# Patient Record
Sex: Female | Born: 1935 | Race: Black or African American | Hispanic: No | Marital: Married | Smoking: Never smoker
Health system: Southern US, Community
[De-identification: ages and names within clinical notes are randomized; demographics above are authoritative.]

## PROBLEM LIST (undated history)

## (undated) DIAGNOSIS — I2510 Atherosclerotic heart disease of native coronary artery without angina pectoris: Secondary | ICD-10-CM

## (undated) DIAGNOSIS — E559 Vitamin D deficiency, unspecified: Secondary | ICD-10-CM

## (undated) DIAGNOSIS — E039 Hypothyroidism, unspecified: Secondary | ICD-10-CM

## (undated) DIAGNOSIS — M8580 Other specified disorders of bone density and structure, unspecified site: Secondary | ICD-10-CM

## (undated) DIAGNOSIS — D649 Anemia, unspecified: Secondary | ICD-10-CM

## (undated) DIAGNOSIS — I219 Acute myocardial infarction, unspecified: Secondary | ICD-10-CM

## (undated) DIAGNOSIS — A048 Other specified bacterial intestinal infections: Secondary | ICD-10-CM

## (undated) DIAGNOSIS — K5790 Diverticulosis of intestine, part unspecified, without perforation or abscess without bleeding: Secondary | ICD-10-CM

## (undated) DIAGNOSIS — M1990 Unspecified osteoarthritis, unspecified site: Secondary | ICD-10-CM

## (undated) DIAGNOSIS — K449 Diaphragmatic hernia without obstruction or gangrene: Secondary | ICD-10-CM

## (undated) DIAGNOSIS — K219 Gastro-esophageal reflux disease without esophagitis: Secondary | ICD-10-CM

## (undated) DIAGNOSIS — E119 Type 2 diabetes mellitus without complications: Secondary | ICD-10-CM

## (undated) DIAGNOSIS — I10 Essential (primary) hypertension: Secondary | ICD-10-CM

## (undated) DIAGNOSIS — I456 Pre-excitation syndrome: Secondary | ICD-10-CM

## (undated) HISTORY — DX: Acute myocardial infarction, unspecified: I21.9

## (undated) HISTORY — DX: Diaphragmatic hernia without obstruction or gangrene: K44.9

## (undated) HISTORY — PX: APPENDECTOMY: SHX54

## (undated) HISTORY — DX: Gastro-esophageal reflux disease without esophagitis: K21.9

## (undated) HISTORY — DX: Unspecified osteoarthritis, unspecified site: M19.90

## (undated) HISTORY — DX: Anemia, unspecified: D64.9

## (undated) HISTORY — DX: Diverticulosis of intestine, part unspecified, without perforation or abscess without bleeding: K57.90

## (undated) HISTORY — DX: Other specified disorders of bone density and structure, unspecified site: M85.80

## (undated) HISTORY — PX: CATARACT EXTRACTION: SUR2

## (undated) HISTORY — PX: COLONOSCOPY: SHX174

## (undated) HISTORY — PX: BREAST SURGERY: SHX581

## (undated) HISTORY — DX: Vitamin D deficiency, unspecified: E55.9

## (undated) HISTORY — DX: Hypothyroidism, unspecified: E03.9

## (undated) HISTORY — DX: Atherosclerotic heart disease of native coronary artery without angina pectoris: I25.10

## (undated) HISTORY — DX: Pre-excitation syndrome: I45.6

## (undated) HISTORY — DX: Other specified bacterial intestinal infections: A04.8

## (undated) HISTORY — DX: Type 2 diabetes mellitus without complications: E11.9

## (undated) HISTORY — DX: Essential (primary) hypertension: I10

## (undated) HISTORY — PX: ABDOMINAL ADHESION SURGERY: SHX90

---

## 1978-12-23 DIAGNOSIS — I2510 Atherosclerotic heart disease of native coronary artery without angina pectoris: Secondary | ICD-10-CM

## 1978-12-23 HISTORY — DX: Atherosclerotic heart disease of native coronary artery without angina pectoris: I25.10

## 1997-02-19 HISTORY — PX: CARDIAC CATHETERIZATION: SHX172

## 1997-04-23 DIAGNOSIS — I456 Pre-excitation syndrome: Secondary | ICD-10-CM

## 1997-04-23 HISTORY — DX: Pre-excitation syndrome: I45.6

## 1997-05-19 HISTORY — PX: ABLATION: SHX5711

## 1998-04-07 ENCOUNTER — Ambulatory Visit (HOSPITAL_COMMUNITY): Admission: RE | Admit: 1998-04-07 | Discharge: 1998-04-07 | Payer: Self-pay | Admitting: Family Medicine

## 1998-06-21 ENCOUNTER — Other Ambulatory Visit: Admission: RE | Admit: 1998-06-21 | Discharge: 1998-06-21 | Payer: Self-pay | Admitting: *Deleted

## 1999-10-02 ENCOUNTER — Other Ambulatory Visit: Admission: RE | Admit: 1999-10-02 | Discharge: 1999-10-02 | Payer: Self-pay | Admitting: *Deleted

## 1999-10-24 ENCOUNTER — Encounter: Payer: Self-pay | Admitting: Family Medicine

## 1999-10-24 ENCOUNTER — Encounter: Admission: RE | Admit: 1999-10-24 | Discharge: 1999-10-24 | Payer: Self-pay | Admitting: Family Medicine

## 2000-12-10 ENCOUNTER — Other Ambulatory Visit: Admission: RE | Admit: 2000-12-10 | Discharge: 2000-12-10 | Payer: Self-pay | Admitting: *Deleted

## 2001-09-15 ENCOUNTER — Inpatient Hospital Stay (HOSPITAL_COMMUNITY): Admission: EM | Admit: 2001-09-15 | Discharge: 2001-09-23 | Payer: Self-pay | Admitting: Emergency Medicine

## 2001-09-16 ENCOUNTER — Encounter: Payer: Self-pay | Admitting: General Surgery

## 2001-09-17 ENCOUNTER — Encounter: Payer: Self-pay | Admitting: General Surgery

## 2001-09-18 ENCOUNTER — Encounter: Payer: Self-pay | Admitting: General Surgery

## 2003-03-30 ENCOUNTER — Encounter: Admission: RE | Admit: 2003-03-30 | Discharge: 2003-03-30 | Payer: Self-pay | Admitting: Family Medicine

## 2008-09-01 ENCOUNTER — Emergency Department (HOSPITAL_COMMUNITY): Admission: EM | Admit: 2008-09-01 | Discharge: 2008-09-01 | Payer: Self-pay | Admitting: Family Medicine

## 2009-04-22 ENCOUNTER — Emergency Department (HOSPITAL_COMMUNITY): Admission: EM | Admit: 2009-04-22 | Discharge: 2009-04-22 | Payer: Self-pay | Admitting: Emergency Medicine

## 2009-04-23 DIAGNOSIS — K5790 Diverticulosis of intestine, part unspecified, without perforation or abscess without bleeding: Secondary | ICD-10-CM

## 2009-04-23 HISTORY — DX: Diverticulosis of intestine, part unspecified, without perforation or abscess without bleeding: K57.90

## 2010-07-24 LAB — POCT I-STAT, CHEM 8
BUN: 11 mg/dL (ref 6–23)
Calcium, Ion: 1.22 mmol/L (ref 1.12–1.32)
Chloride: 103 mEq/L (ref 96–112)
Creatinine, Ser: 0.6 mg/dL (ref 0.4–1.2)
Glucose, Bld: 171 mg/dL — ABNORMAL HIGH (ref 70–99)
HCT: 36 % (ref 36.0–46.0)
Hemoglobin: 12.2 g/dL (ref 12.0–15.0)
Potassium: 4 mEq/L (ref 3.5–5.1)
Sodium: 138 mEq/L (ref 135–145)
TCO2: 28 mmol/L (ref 0–100)

## 2010-07-24 LAB — GLUCOSE, CAPILLARY: Glucose-Capillary: 162 mg/dL — ABNORMAL HIGH (ref 70–99)

## 2010-09-08 NOTE — Discharge Summary (Signed)
[facility]  Patient:    Judy Wells, Judy Wells Visit Number: [id number] MRN: [id number]          Service Type: MED Location: [id number] Attending Physician:  Henrene Dodge Dictated by:   Anselm Pancoast. Zachery Dakins, M.D. Admit Date:  09/15/2001 Discharge Date: 09/23/2001   CC:         Quita Skye. Artis Flock, M.D.   Discharge Summary  DISCHARGE DIAGNOSES: 1. Small bowel obstruction secondary to adhesions, previous appendectomy. 2. Diabetes mellitus, type 2. 3. Exogenous obesity.  HISTORY OF PRESENT ILLNESS/HOSPITAL COURSE:  The patient is a 76 year old overweight black female who presented to the emergency room after eating lunch with severe epigastric abdominal pain.  Patient is an adult-onset diabetic followed by Dr. Bradd Canary with oral glucose medication.  Approximately a week earlier, she had had an episode of nausea and vomiting that subsided spontaneously which she localized in the epigastric area.  It then reoccurred on the day of her admission after eating lunch.  She weighs approximately 225 pounds and presented to the emergency room where lab studies showed a white count of 5500, hematocrit of 38, glucose 208.  An acute abdominal series showed some dilated loops of small bowel and a question of whether she possibly could have an obstruction.  Her only previous abdominal surgery had been an appendectomy performed by Dr. Wiliam Ke years ago and her pain appeared to be what she localized in the epigastric area.  With the episode occurring approximately a week ago and then subsided, I have wondered if she could be possibly having gallbladder attacks and placed her in the hospital, NG suction, IVs, and scheduled an ultrasound and repeat abdominal x-rays the following morning.  The x-rays were obtained and the findings were the small bowel was thought to be less obstructive, no free air.  An ultrasound was thought to have sludge and possibly some minimal  ______ calcified stones may be in the gallbladder.  We therefore then were question whether or not she was improving.  Maybe she did have gallstones and I scheduled her for CTs and they are still not as convinced that this was definitely just a typical gallbladder attack since she was so gaseous as she was.  The CT was performed later that evening which showed probably an obstruction secondary to adhesions in the distal small bowel.  I attempted to try to get her on the OR schedule early the next day but, because of scheduling problems in the center, it was late in the afternoon before we finally got her to surgery and, at the time of surgery, she was found to have kind of a single adhesion to the terminal ileum to the ileocecal valve area so that she had a real high partial obstruction of the terminal ileum with kind of a ______ closed loop and this was released with the bowel pinked up nicely.  I decompressed the small bowel, sucking it back up into the stomach, and then on palpation of the gallbladder, I could not feel any stones and the gallbladder certainly did not look acutely inflamed.  Therefore, we did not remove the gallbladder and I kept her on NG suction.  Postoperatively, I did put her in the unit since she is heavy enough that I was concerned about her breathing and, during the recovery room, she became hypoxic right after she was first extubated and had to be reintubated. However, she was fine.  CPK-MB enzymes showed no evidence of any cardiac  ischemia, etc., and she did nicely, did have, of course, keep the NG tube. But, she was transferred out of the unit on the second day continuing to improve.  Her incision was healing without signs of infection, the staples were removed and wound Steri-Stripped, and she was discharged home in improved condition on September 23, 2001.  She has mild pain medication, if needed, continues on oral medications, and will see me at the office in one  week. Dictated by:   Anselm Pancoast. Zachery Dakins, M.D. Attending Physician:  Henrene Dodge DD:  09/29/01 TD:  10/01/01 Job: [id number]

## 2010-09-08 NOTE — H&P (Signed)
[facility]  Patient:    Judy Wells, HULT Visit Number: [id number] MRN: [id number]          Service Type: MED Location: [id number] Attending Physician:  Henrene Dodge Dictated by:   Anselm Pancoast. Zachery Dakins, M.D. Admit Date:  09/15/2001                           History and Physical  CHIEF COMPLAINT:  Epigastric pain and nausea.  HISTORY OF PRESENT ILLNESS:  Judy Wells is a 76 year old overweight black female who presented to the emergency room after eating lunch with abdominal pain.  She said she is a diabetic followed with Quita Skye. Kindl, M.D., on oral glucose medication and that approximately a week ago she had an episode of nausea and vomiting which subsided spontaneously.  Then it recurred today after lunch.  The patient presented to the emergency room late in the afternoon and on examination, she weighs about 225 pounds but she was slightly bloated, and lab studies showed a white count of 5500, hematocrit of 38, glucose of 208.  Acute abdomen series was obtained, and it showed some dilated loops of small bowel with a question of whether she had an intestinal obstruction.  Her only previous abdominal surgery had been an appendectomy performed by Dr. Wiliam Ke through a small McBurney incision years ago, but she was not acutely tender in any quadrants of the abdomen.  With the pain occurring one week ago that spontaneously subsided after a short time and was predominantly all epigastric, the question is whether this is mechanical obstruction or whether it is possibly biliary in origin.  She denies weight loss and denies episodes of chronic abdominal pain.  MEDICATIONS:  She takes Glucophage 500 mg b.i.d., and Glucotrol 10 mg one daily.  She takes one aspirin.  ALLERGIES:  She denies any allergies.  REVIEW OF SYSTEMS:  EYES, EARS, NOSE, AND THROAT:  Denies chronic problems. MUSCULOSKELETAL:  She has kind of large joint pain, mild arthritis,  probably related to her weight.  CARDIAC:  No history of high blood pressure or chest pain.  PULMONARY:  She does not smoke.  NEUROLOGIC:  She says sometimes she will get kind of cramping and tingling in her legs, but she does not really have a significant peripheral neuropathy.  PHYSICAL EXAMINATION:  VITAL SIGNS:  Temperature was 99, pulse 84, respirations 16, blood pressure 158/96.  She is 5 feet 2 inches and weighs about 240 pounds.  GENERAL:  She is a pleasant, very heavy female.  HEENT:  Appears adequately hydrated.  NECK:  No cervical or supraclavicular lymphadenopathy and no carotid bruits.  CHEST:  Clear.  CARDIAC:  Normal sinus rhythm.  BREASTS:  Large.  No masses noted.  ABDOMEN:  She has a large, soft abdomen, a small about three-inch McBurney incision.  No evidence of any umbilical, incisional, or inguinal hernias that I can appreciate.  RECTAL:  Soft stool in the rectum.  EXTREMITIES:  There is no pedal edema.  NEUROLOGIC:  Lower feet appear to have adequate sensation.  No neurotrophic ulcers are seen.  DIAGNOSTIC STUDIES:  Acute abdomen series:  It looks like this is an early small bowel obstruction.  There are a couple of dilated loops of small bowel. Cannot see an actual point.  There is no free air, and there is stool in the colon but there is no dilatation of the colon in any areas.  ADMISSION  IMPRESSION: 1. Probably early small bowel obstruction, possibly gallbladder disease.    The patient will be admitted, hydrated nasogastric placed in the ER.    Moderate amount of NG drainage removed.  We will wait on antibiotics since    she is not febrile or localized abdominal tenderness, and I am planning to    repeat the plain abdominal films in the a.m. and also obtain an ultrasound    of her gallbladder. 2. Diabetes mellitus, controlled with oral medication, probably secondary to    her obesity. Dictated by:   Anselm Pancoast. Zachery Dakins, M.D. Attending  Physician:  Henrene Dodge DD:  09/17/01 TD:  09/18/01 Job: [id number]

## 2010-09-08 NOTE — Op Note (Signed)
[facility]  Patient:    Judy Wells, Judy Wells Visit Number: [id number] MRN: [id number]          Service Type: MED Location: 1W [id number] 01 Attending Physician:  Henrene Dodge Dictated by:   Anselm Pancoast. Zachery Dakins, M.D. Proc. Date: 09/17/01 Admit Date:  09/15/2001                             Operative Report  PREOPERATIVE DIAGNOSES: 1. Small-bowel obstruction secondary to probably adhesions. 2. Possible gallstones.  POSTOPERATIVE DIAGNOSIS:  Small-bowel obstruction secondary to adhesions  OPERATION:  Exploratory laparotomy with lysis of adhesions for release of small-bowel obstruction.  SURGEON:  Anselm Pancoast. Zachery Dakins, M.D.  ASSISTANT:  Vikki Ports, M.D.  ANESTHESIA:  General.  HISTORY:  Judy Wells is a 76 year old, extremely overweight female, who was admitted late Monday evening after an approximately 12 hour history of cramping abdominal pain and nausea.  She stated that one week earlier, she had had a short episode of pain that had spontaneously subsided.  She is a patient that weighs about 240 pounds, 5 feet 2 inches, and has an elevated glucose on oral glucose medication and is followed by Dr. Orvan Falconer.  The patient presented to the ER and, on examination, she has a large abdomen.  She has had a recent appendectomy that was done by Dr. Wiliam Ke years ago and, on exam, she was not tender in any quadrant of her abdomen, and abdominal films did show dilated loops of small bowel, but there was stool in the colon.  With this previous episode of pain that subsided, I wondered if she possibly could have had a gallbladder attack.  I placed a nasogastric in her and sucked on her, and the following morning we did an ultrasound of the gallbladder and also abdominal films.  She still had a dilated loop of small bowel but was thought to be improved.  There was a little bit of gas in the colon, and I elected to then do a CT with oral  contrast.  This showed that she had a definite dilatation of the proximal small bowel.  You could not see any gas or problem in the distal small bowel, and it appeared that there was an obstruction down in the right lower quadrant.  She was afebrile, not tender in any quadrants, and I added her to the OR schedule for today.  Unfortunately, because of scheduling problems, we were not able to get her on the schedule until this evening.  During the past 12 hours, she has had a little more cramping abdominal pain and bloating.  Preoperatively, she was given 3 g of Unasyn. She has PAS stockings and taken to the OR suite.  DESCRIPTION OF PROCEDURE:  Induction of general anesthesia endotracheal tube; she already has an NG tube.  Foley catheter was inserted sterilely, and then the abdomen was prepped with Betadine surgical scrub and solution and draped in a sterile manner.  I made a small incision slightly above and below the umbilicus down through approximately four inches of adipose tissue and then opening the linea alba.  I then admitted my finger into the peritoneal cavity. You could feel some adhesions over in the right lower quadrant where she has had a previous appendectomy, but you could feel a loop of dilated intestine down in this area, and I extended the incision a little proximally and inferiorly so I could get my hand and  a Editor, commissioning in.  With this and lysis of adhesions, I could see that basically she had nearly a closed loop.  The bowel was still viable; it was hyperemic, and I cut this adhesion. The distal portion of adhesions was about two inches from the ileocecal valve, and then the valve pinked-up quite nicely.  As far as the more proximal small bowel, it was very dilated, and I milked out the small bowel contents back and aspirated through the NG tube and then reinspected the area where we had divided this large adhesion.  The bowel was definitely viable and would  not need to be resected.  The area actually in this loop was about probably 2.5-3 feet of intestine.  There is stool in the colon.  I am sure that there was not hardly any gas or small bowel contents going into the colon or why the stool has not been propelled on through.  After this was done, I retracted very severely upward, put her in a reverse Trendelenburg position, and could visualize the gallbladder.  It was nice and thin, certainly not chronically thickened and then feeling the gallbladder, it was filled with bile, but you could squeeze it, and I could not feel any stones in the proximal portion of the gallbladder.  I think that the gallbladder was just dilated on Monday when they did the ultrasound since she was not able to eat, and the radiologist had been kind of iffy on whether they could definitely see a stone or not see a stone because it was not really good shadowing.  The small bowel was placed back in anatomical position.  The omentum was placed over this, and then I closed the fascia with interrupted sutures of #1 Novofil.  The subcutaneous tissue was closed with 3-0 chromic, and then the skin was closed with nylon. The patient tolerated the procedure satisfactorily, and hopefully she will be able to breathe spontaneously and return to the floor.  The patients vital signs were stable throughout surgery, and her glucose was I think about 120 an hour or two prior to surgery.  Sponge and needle counts were correct x 2. Dictated by:   Anselm Pancoast. Zachery Dakins, M.D. Attending Physician:  Henrene Dodge DD:  09/17/01 TD:  09/19/01 Job: [id number]

## 2011-11-22 DIAGNOSIS — A048 Other specified bacterial intestinal infections: Secondary | ICD-10-CM

## 2011-11-22 HISTORY — DX: Other specified bacterial intestinal infections: A04.8

## 2011-11-23 ENCOUNTER — Telehealth: Payer: Self-pay | Admitting: *Deleted

## 2011-11-23 NOTE — Telephone Encounter (Signed)
called patient on 11-20-2011 home number does not have an answering machine called the workplaqce patient works at [employer] but I did not know what department so they were unable to find the patient

## 2011-11-29 ENCOUNTER — Telehealth: Payer: Self-pay | Admitting: *Deleted

## 2011-11-29 NOTE — Telephone Encounter (Signed)
CAN NOT REACH PATIENT I HAVE BEEN TRYING SENSE 11-23-2011 AND 11-20-2011 GIVING BACK TO TIFFANY ON 11-29-2011

## 2011-12-07 ENCOUNTER — Telehealth: Payer: Self-pay | Admitting: Oncology

## 2011-12-07 NOTE — Telephone Encounter (Signed)
Referring Dr. Maryelizabeth Rowan  Dx- Anemia NP packet mailed out.

## 2011-12-07 NOTE — Telephone Encounter (Signed)
C/D on 8/16 for 8/26 visit.

## 2011-12-07 NOTE — Telephone Encounter (Signed)
Pt called for new pt appt. gv pt appt for 8/26 w/HH. FS will be out of office until 9/3.

## 2011-12-14 ENCOUNTER — Encounter: Payer: Self-pay | Admitting: Oncology

## 2011-12-14 DIAGNOSIS — D638 Anemia in other chronic diseases classified elsewhere: Secondary | ICD-10-CM | POA: Insufficient documentation

## 2011-12-17 ENCOUNTER — Ambulatory Visit (HOSPITAL_BASED_OUTPATIENT_CLINIC_OR_DEPARTMENT_OTHER): Payer: Medicare Other | Admitting: Oncology

## 2011-12-17 ENCOUNTER — Other Ambulatory Visit (HOSPITAL_BASED_OUTPATIENT_CLINIC_OR_DEPARTMENT_OTHER): Payer: Medicare Other | Admitting: Lab

## 2011-12-17 ENCOUNTER — Encounter: Payer: Self-pay | Admitting: Oncology

## 2011-12-17 ENCOUNTER — Ambulatory Visit: Payer: Medicare Other

## 2011-12-17 VITALS — BP 140/82 | HR 77 | Temp 97.4°F | Resp 20 | Ht 60.5 in | Wt 219.4 lb

## 2011-12-17 DIAGNOSIS — D649 Anemia, unspecified: Secondary | ICD-10-CM

## 2011-12-17 DIAGNOSIS — K922 Gastrointestinal hemorrhage, unspecified: Secondary | ICD-10-CM

## 2011-12-17 DIAGNOSIS — E119 Type 2 diabetes mellitus without complications: Secondary | ICD-10-CM

## 2011-12-17 DIAGNOSIS — D500 Iron deficiency anemia secondary to blood loss (chronic): Secondary | ICD-10-CM

## 2011-12-17 DIAGNOSIS — I10 Essential (primary) hypertension: Secondary | ICD-10-CM

## 2011-12-17 LAB — CBC & DIFF AND RETIC
BASO%: 1.3 % (ref 0.0–2.0)
Basophils Absolute: 0.1 10*3/uL (ref 0.0–0.1)
EOS%: 2.1 % (ref 0.0–7.0)
Eosinophils Absolute: 0.1 10*3/uL (ref 0.0–0.5)
HCT: 35.2 % (ref 34.8–46.6)
HGB: 11.3 g/dL — ABNORMAL LOW (ref 11.6–15.9)
Immature Retic Fract: 5.5 % (ref 1.60–10.00)
LYMPH%: 51 % — ABNORMAL HIGH (ref 14.0–49.7)
MCH: 29.5 pg (ref 25.1–34.0)
MCHC: 32.1 g/dL (ref 31.5–36.0)
MCV: 91.9 fL (ref 79.5–101.0)
MONO#: 0.2 10*3/uL (ref 0.1–0.9)
MONO%: 5.6 % (ref 0.0–14.0)
NEUT#: 1.6 10*3/uL (ref 1.5–6.5)
NEUT%: 40 % (ref 38.4–76.8)
Platelets: 277 10*3/uL (ref 145–400)
RBC: 3.83 10*6/uL (ref 3.70–5.45)
RDW: 14.1 % (ref 11.2–14.5)
Retic %: 1.39 % (ref 0.70–2.10)
Retic Ct Abs: 53.24 10*3/uL (ref 33.70–90.70)
WBC: 3.9 10*3/uL (ref 3.9–10.3)
lymph#: 2 10*3/uL (ref 0.9–3.3)

## 2011-12-17 LAB — MORPHOLOGY: PLT EST: ADEQUATE

## 2011-12-17 LAB — LACTATE DEHYDROGENASE (CC13): LDH: 141 U/L (ref 125–220)

## 2011-12-17 LAB — CHCC SMEAR

## 2011-12-17 NOTE — Patient Instructions (Addendum)
1.  Issue:  Mild anemia. 2.  Potential causes:  dyserythropoiesis (reduced production of red blood cell as part of aging).  I need to rule out other potential causes:  Iron/Vit B12 deficiency, myeloma.  Consider colonoscopy if has not done so.  3.  Follow up:  Lab check at the [facility] in about 3 and 6 months.  Return in about 9 months.

## 2011-12-17 NOTE — Progress Notes (Signed)
[facility]  Telephone:([phone] [phone]     INITIAL HEMATOLOGY CONSULTATION    Referral MD:  Dr. Maryelizabeth Rowan, M.D.  Reason for Referral: anemia.    HPI: Judy Wells is a 77 year-old woman with history of chronic anemia for many years.  She saw GI and had a negative colonoscopy in 2011 except for pandiverticular disease. She again has slight anemia this year.  On 7/ 19/2013; her WBC was 4.5; hgb 10.5; plt 279.  She was referred to GI.  An EGD was performed on 11/29/2011 with a gastric biopsy showed chronic active gastritis and intestinal metaplasia.  H.pylori was negative by direct observation.  However, the pattern was consistent with H.pylori.  Dr. Duanne Guess obtained a breath test which was positive for Hpylori on breath test.  She was given a course of Hpylori treatment.  She was kindly referred to the Cancer Center for her anemia.   Judy Wells presented to the clinic for the first time today by herself.  She denied any visible source of bleeding.  She had mild midepigastric discomfort which has significantly improved with Hpylori treatment.  She has bilateral knee pain which improves with Vic vapors spray.  She denied lower back pain, leg weakness, bowel bladder incontinence.   Patient denies fever, anorexia, weight loss, fatigue, headache, visual changes, confusion, drenching night sweats, palpable lymph node swelling, mucositis, odynophagia, dysphagia, nausea vomiting, jaundice, chest pain, palpitation, shortness of breath, dyspnea on exertion, productive cough, gum bleeding, epistaxis, hematemesis, hemoptysis, abdominal pain, abdominal swelling, early satiety, melena, hematochezia, hematuria, skin rash, spontaneous bleeding, joint swelling, heat or cold intolerance, bowel bladder incontinence, back pain, focal motor weakness, paresthesia, depression, suicidal or homicidal ideation, feeling hopelessness.    Past Medical History  Diagnosis Date    . H. pylori infection 11/2011  . Hypothyroid   . GERD (gastroesophageal reflux disease)   . Anemia   . Diabetes mellitus type II   . HTN (hypertension)   . Hiatal hernia   . Osteopenia   . Osteoarthritis   . Vitamin d deficiency   . Coronary artery disease 1980's  . Wolf-Parkinson-White syndrome 1999  :    Past Surgical History  Procedure Date  . Appendectomy   . Abdominal adhesion surgery   . Colonoscopy   :   CURRENT MEDS: Current Outpatient Prescriptions  Medication Sig Dispense Refill  . glipiZIDE (GLUCOTROL) 10 MG tablet Take 10 mg by mouth daily.      Marland Kitchen levothyroxine (SYNTHROID) 125 MCG tablet Take 125 mcg by mouth daily.      . metFORMIN (GLUCOPHAGE) 1000 MG tablet Take 1,000 mg by mouth daily with breakfast.      . omeprazole (PRILOSEC) 40 MG capsule Take 40 mg by mouth daily.          No Known Allergies:  Family History  Problem Relation Age of Onset  . Dementia Mother   :  History   Social History  . Marital Status: Married    Spouse Name: N/A    Number of Children: N/A  . Years of Education: N/A   Occupational History  . Not on file.   Social History Main Topics  . Smoking status: Never Smoker   . Smokeless tobacco: Never Used  . Alcohol Use: No  . Drug Use: No  . Sexually Active:    Other Topics Concern  . Not on file   Social History Narrative  . No narrative on file  :  REVIEW OF SYSTEM:  The rest of the 14-point review of sytem was negative.   Exam: ECOG 1  General: mildly obese woman, in no acute distress.  Eyes:  no scleral icterus.  ENT:  There were no oropharyngeal lesions.  Neck was without thyromegaly.  Lymphatics:  Negative cervical, supraclavicular or axillary adenopathy.  Respiratory: lungs were clear bilaterally without wheezing or crackles.  Cardiovascular:  Regular rate and rhythm, S1/S2, without murmur, rub or gallop.  There was no pedal edema.  GI:  abdomen was soft, nontender, mildly obese, without organomegaly.   Muscoloskeletal:  no spinal tenderness of palpation of vertebral spine.  Skin exam was without echymosis, petichae.  Neuro exam was nonfocal.  Patient was able to get on and off exam table without assistance.  Gait was normal.  Patient was alerted and oriented.  Attention was good.   Language was appropriate.  Mood was normal without depression.  Speech was not pressured.  Thought content was not tangential.    LABS:  Lab Results  Component Value Date   WBC 3.9 12/17/2011   HGB 11.3* 12/17/2011   HCT 35.2 12/17/2011   PLT 277 12/17/2011   GLUCOSE 171* 04/22/2009   NA 138 04/22/2009   K 4.0 04/22/2009   CL 103 04/22/2009   CREATININE 0.6 04/22/2009   BUN 11 04/22/2009   Blood smear review:   I personally reviewed the patient's peripheral blood smear today.  There was isocytosis.  There was no peripheral blast.  There was no schistocytosis, spherocytosis, target cell, rouleaux formation, tear drop cell.  There was no giant platelets or platelet clumps.      ASSESSMENT AND PLAN:   1. Anemia:   - Most likely due to anemia of chronic slow GI bleed with iron deficiency anemia given pandiverticular disease.  Other possibilities include dyserythropoiesis.  It less likely due to MDS or pure red cell aplasia given only slight anemia.  I also need to rule out myeloma.   - Work up:  I sent for iron panel, SPEP/serum free light chain.   - Treatment:  Her Hgb has already improved from last time.  I'm awaiting my work up before starting any intervention.  If work up is negative, then the diagnosis of exclusion will be dyserythropoiesis.  The treatment for dyserythropoiesis is watchful observation and if her Hgb steadily declines to <9 or she develops pancytopenia, I may consider diagnostic bone marrow biopsy.   2.  Diabetes mellitus, type II: on glipizie and metformin per PCP.   3.  GERD with Hpylori:  S/p on empiric course of treatment per PCP.   4.  Hypothyrodism:  On synthroid per PCP.   5.   Follow up:  Lab check for CBC here at the Cancer Center in about 3 and 6 months.  She has follow up appointment in about 9 months   Thank you for this referral.    The length of time of the face-to-face encounter was 30 minutes. More than 50% of time was spent counseling and coordination of care.

## 2011-12-19 LAB — IRON AND TIBC
%SAT: 25 % (ref 20–55)
Iron: 71 ug/dL (ref 42–145)
TIBC: 286 ug/dL (ref 250–470)
UIBC: 215 ug/dL (ref 125–400)

## 2011-12-19 LAB — PROTEIN ELECTROPHORESIS, SERUM
Albumin ELP: 54.7 % — ABNORMAL LOW (ref 55.8–66.1)
Alpha-1-Globulin: 3.7 % (ref 2.9–4.9)
Alpha-2-Globulin: 9.4 % (ref 7.1–11.8)
Beta 2: 5.3 % (ref 3.2–6.5)
Beta Globulin: 5.1 % (ref 4.7–7.2)
Gamma Globulin: 21.8 % — ABNORMAL HIGH (ref 11.1–18.8)
Total Protein, Serum Electrophoresis: 7.1 g/dL (ref 6.0–8.3)

## 2011-12-19 LAB — ERYTHROPOIETIN: Erythropoietin: 23.7 m[IU]/mL (ref 2.6–34.0)

## 2011-12-19 LAB — KAPPA/LAMBDA LIGHT CHAINS
Kappa free light chain: 1.77 mg/dL (ref 0.33–1.94)
Kappa:Lambda Ratio: 1.12 (ref 0.26–1.65)
Lambda Free Lght Chn: 1.58 mg/dL (ref 0.57–2.63)

## 2011-12-19 LAB — VITAMIN B12: Vitamin B-12: 473 pg/mL (ref 211–911)

## 2011-12-19 LAB — FERRITIN: Ferritin: 33 ng/mL (ref 10–291)

## 2012-01-22 HISTORY — PX: NM MYOCAR PERF WALL MOTION: HXRAD629

## 2012-03-05 ENCOUNTER — Ambulatory Visit: Payer: Medicare Other | Admitting: Gynecology

## 2012-03-13 ENCOUNTER — Other Ambulatory Visit (HOSPITAL_BASED_OUTPATIENT_CLINIC_OR_DEPARTMENT_OTHER): Payer: Medicare Other | Admitting: Lab

## 2012-03-13 DIAGNOSIS — D649 Anemia, unspecified: Secondary | ICD-10-CM

## 2012-03-13 LAB — CBC WITH DIFFERENTIAL/PLATELET
BASO%: 1.2 % (ref 0.0–2.0)
Basophils Absolute: 0.1 10*3/uL (ref 0.0–0.1)
EOS%: 1.7 % (ref 0.0–7.0)
Eosinophils Absolute: 0.1 10*3/uL (ref 0.0–0.5)
HCT: 33.7 % — ABNORMAL LOW (ref 34.8–46.6)
HGB: 10.9 g/dL — ABNORMAL LOW (ref 11.6–15.9)
LYMPH%: 45.7 % (ref 14.0–49.7)
MCH: 30 pg (ref 25.1–34.0)
MCHC: 32.3 g/dL (ref 31.5–36.0)
MCV: 92.9 fL (ref 79.5–101.0)
MONO#: 0.4 10*3/uL (ref 0.1–0.9)
MONO%: 8.3 % (ref 0.0–14.0)
NEUT#: 2.1 10*3/uL (ref 1.5–6.5)
NEUT%: 43.1 % (ref 38.4–76.8)
Platelets: 259 10*3/uL (ref 145–400)
RBC: 3.62 10*6/uL — ABNORMAL LOW (ref 3.70–5.45)
RDW: 14.6 % — ABNORMAL HIGH (ref 11.2–14.5)
WBC: 4.8 10*3/uL (ref 3.9–10.3)
lymph#: 2.2 10*3/uL (ref 0.9–3.3)

## 2012-03-14 ENCOUNTER — Telehealth: Payer: Self-pay | Admitting: *Deleted

## 2012-03-14 NOTE — Telephone Encounter (Signed)
Message copied by Wende Mott on Fri Mar 14, 2012  1:27 PM ------      Message from: Kallie Locks      Created: Caleen Essex Mar 14, 2012  1:02 PM                   ----- Message -----         From: Exie Parody, MD         Sent: 03/13/2012   3:48 PM           To: Marcell Barlow, RN            Please call pt.  Her Hgb is stable.  Continue observation.  If she can tolerate OTC iron, she should take it.   Such as SlowFe 325mg  PO BID or NuIron 150mg  PO BID.

## 2012-03-14 NOTE — Telephone Encounter (Signed)
Called pt w/ CBC results,  Explained Hgb a little low, but still stable.  Instructed on taking oral iron twice daily as prescribed by Dr. Gaylyn Rong.  Pt wrote down the names and doses of meds recommended by Dr. Gaylyn Rong.  Keep next lab appt in 3 months as scheduled.  She verbalized understanding.

## 2012-03-26 ENCOUNTER — Encounter: Payer: Self-pay | Admitting: Gynecology

## 2012-03-26 ENCOUNTER — Ambulatory Visit (INDEPENDENT_AMBULATORY_CARE_PROVIDER_SITE_OTHER): Payer: Medicare Other | Admitting: Gynecology

## 2012-03-26 VITALS — BP 116/70 | Ht 63.0 in | Wt 226.0 lb

## 2012-03-26 DIAGNOSIS — M899 Disorder of bone, unspecified: Secondary | ICD-10-CM

## 2012-03-26 DIAGNOSIS — N952 Postmenopausal atrophic vaginitis: Secondary | ICD-10-CM

## 2012-03-26 DIAGNOSIS — R21 Rash and other nonspecific skin eruption: Secondary | ICD-10-CM

## 2012-03-26 DIAGNOSIS — M8580 Other specified disorders of bone density and structure, unspecified site: Secondary | ICD-10-CM

## 2012-03-26 DIAGNOSIS — K429 Umbilical hernia without obstruction or gangrene: Secondary | ICD-10-CM

## 2012-03-26 DIAGNOSIS — N898 Other specified noninflammatory disorders of vagina: Secondary | ICD-10-CM

## 2012-03-26 DIAGNOSIS — M949 Disorder of cartilage, unspecified: Secondary | ICD-10-CM

## 2012-03-26 LAB — WET PREP FOR TRICH, YEAST, CLUE
Clue Cells Wet Prep HPF POC: NONE SEEN
Trich, Wet Prep: NONE SEEN
Yeast Wet Prep HPF POC: NONE SEEN

## 2012-03-26 MED ORDER — FLUCONAZOLE 200 MG PO TABS: 200.0000 mg | ORAL_TABLET | Freq: Every day | ORAL | Status: DC

## 2012-03-26 MED ORDER — NYSTATIN-TRIAMCINOLONE 100000-0.1 UNIT/GM-% EX OINT: TOPICAL_OINTMENT | Freq: Two times a day (BID) | CUTANEOUS | Status: DC

## 2012-03-26 NOTE — Patient Instructions (Addendum)
Take yeast pill daily for 3 days Use the cream prescribed on the buttocks rash as needed. Follow up in one to 2 years

## 2012-03-26 NOTE — Progress Notes (Signed)
Judy Wells 1935-01-28 [id number]        77 y.o.  [id number] New patient, former patient of Dr. Kyra Manges complaining of buttocks rash.  Past medical history,surgical history, medications, allergies, family history and social history were all reviewed and documented in the EPIC chart. ROS:  Was performed and pertinent positives and negatives are included in the history.  Exam: Sherrilyn Rist assistant Filed Vitals:   03/26/12 1136  BP: 116/70  Height: 5\' 3"  (1.6 m)  Weight: 226 lb (102.513 kg)   General appearance  Normal Skin grossly normal Head/Neck normal with no cervical or supraclavicular adenopathy thyroid normal Lungs  clear Cardiac RR, without RMG Abdominal  soft, nontender, without masses, organomegaly. Well-healed scars. Easily reducible umbilical hernia Breasts  examined lying and sitting without masses, retractions, discharge or axillary adenopathy. Pelvic  Ext/BUS/vagina  Atrophic with white discharge  Cervix  normal atrophic  Uterus  Grossly normal size exam limited by abdominal girth  Adnexa  Without masses or tenderness  Exam limited by abdominal girth  Anus and perineum  Upper buttocks and upper gluteal fold with generalized skin rash consistent with eczema/uncal. No skin breaks or lesions  Rectovaginal  normal sphincter tone without palpated masses or tenderness.    Assessment/Plan:  77 y.o. [id number] female    1. Skin rash/white vaginal discharge. I suspect a yeast dermatitis with a low-level yeast vaginitis. Wet prep was negative but she is diabetic and obese. Will cover with Diflucan 200 mg daily x3 doses and Mytrex cream externally as needed. Assuming her symptoms clear then we'll follow. If her dermatitis persists and she knows that she will need to see a dermatologist. 2. Osteopenia. She has a history of osteopenia but I have no DEXA reports. She's being followed by her primary physician. I'm going to try to get copies of these reports. She is actively taking no  medications. I discussed calcium and vitamin D with her. 3. Umbilical hernia. Asymptomatic. I discussed the issues of incarceration and he ASAP call precautions. She's not interested in repair. 4. Postmenopausal/atrophic vaginitis. Patient is asymptomatic and we'll continue to monitor. 5. Pap smear 2011. No Pap smear done today. No history of abnormal Pap smears. I discussed current screening guidelines and she is 77 I recommended stop screening and she is comfortable with this. 6. Mammography reported July 2013. I have no copies of this and will try to get this record also. Continue with annual mammography. SBE monthly reviewed. 7. Colonoscopy 2011. Follow up recommended screening interval. 8. Health maintenance. No blood work done as it is all done through her primary physician's office who she sees on a regular basis. Follow up one year, sooner as needed.    Dara Lords MD, 11:58 AM 03/26/2012

## 2012-06-12 ENCOUNTER — Other Ambulatory Visit: Payer: Medicare Other | Admitting: Lab

## 2012-06-12 DIAGNOSIS — D649 Anemia, unspecified: Secondary | ICD-10-CM

## 2012-06-12 LAB — CBC WITH DIFFERENTIAL/PLATELET
BASO%: 0.6 % (ref 0.0–2.0)
Basophils Absolute: 0 10*3/uL (ref 0.0–0.1)
EOS%: 2.1 % (ref 0.0–7.0)
Eosinophils Absolute: 0.1 10*3/uL (ref 0.0–0.5)
HCT: 35.5 % (ref 34.8–46.6)
HGB: 11.3 g/dL — ABNORMAL LOW (ref 11.6–15.9)
LYMPH%: 47.5 % (ref 14.0–49.7)
MCH: 29.7 pg (ref 25.1–34.0)
MCHC: 31.8 g/dL (ref 31.5–36.0)
MCV: 93.4 fL (ref 79.5–101.0)
MONO#: 0.5 10*3/uL (ref 0.1–0.9)
MONO%: 10.2 % (ref 0.0–14.0)
NEUT#: 2 10*3/uL (ref 1.5–6.5)
NEUT%: 39.6 % (ref 38.4–76.8)
Platelets: 261 10*3/uL (ref 145–400)
RBC: 3.8 10*6/uL (ref 3.70–5.45)
RDW: 13.9 % (ref 11.2–14.5)
WBC: 5.1 10*3/uL (ref 3.9–10.3)
lymph#: 2.4 10*3/uL (ref 0.9–3.3)

## 2012-06-13 ENCOUNTER — Telehealth: Payer: Self-pay | Admitting: *Deleted

## 2012-06-13 NOTE — Telephone Encounter (Signed)
Pt not at home.  Spoke w/ husband.  Informed of anemia stable and for pt to keep next appt in May as scheduled. Pt may call us back if any questions.  He verbalized understanding.

## 2012-06-13 NOTE — Telephone Encounter (Signed)
Message copied by Wende Mott on Fri Jun 13, 2012 10:55 AM ------      Message from: Jethro Bolus T      Created: Fri Jun 13, 2012  8:46 AM       Please call pt.  Her anemia is stable.  This is most likely benign.  There was no evidence of any vitamin/iron deficiency from past work up.  I recommend to continue observation.  Thankx. ------

## 2012-09-11 ENCOUNTER — Other Ambulatory Visit: Payer: Medicare Other | Admitting: Lab

## 2012-09-11 ENCOUNTER — Ambulatory Visit: Payer: Medicare Other | Admitting: Oncology

## 2013-05-04 ENCOUNTER — Telehealth: Payer: Self-pay | Admitting: Hematology and Oncology

## 2013-05-04 NOTE — Telephone Encounter (Signed)
returned pt's call. pt want appt w/new doctor. former HH pt ftka 09/11/12. gv pt new appt for 1/26 lb/NG @ 11am.

## 2013-05-08 ENCOUNTER — Encounter: Payer: Self-pay | Admitting: Gynecology

## 2013-05-15 ENCOUNTER — Telehealth: Payer: Self-pay | Admitting: Hematology and Oncology

## 2013-05-15 NOTE — Telephone Encounter (Signed)
Talked to patient and gave her appt for 05/18/13

## 2013-05-18 ENCOUNTER — Ambulatory Visit (HOSPITAL_BASED_OUTPATIENT_CLINIC_OR_DEPARTMENT_OTHER): Payer: Medicare Other | Admitting: Hematology and Oncology

## 2013-05-18 ENCOUNTER — Other Ambulatory Visit: Payer: Self-pay | Admitting: Hematology and Oncology

## 2013-05-18 ENCOUNTER — Telehealth: Payer: Self-pay | Admitting: Hematology and Oncology

## 2013-05-18 ENCOUNTER — Encounter: Payer: Self-pay | Admitting: Hematology and Oncology

## 2013-05-18 ENCOUNTER — Other Ambulatory Visit (HOSPITAL_BASED_OUTPATIENT_CLINIC_OR_DEPARTMENT_OTHER): Payer: Medicare Other

## 2013-05-18 VITALS — BP 149/63 | HR 71 | Temp 98.2°F | Resp 20 | Ht 63.0 in | Wt 230.7 lb

## 2013-05-18 DIAGNOSIS — R5381 Other malaise: Secondary | ICD-10-CM

## 2013-05-18 DIAGNOSIS — D649 Anemia, unspecified: Secondary | ICD-10-CM

## 2013-05-18 DIAGNOSIS — E039 Hypothyroidism, unspecified: Secondary | ICD-10-CM

## 2013-05-18 DIAGNOSIS — R5383 Other fatigue: Secondary | ICD-10-CM

## 2013-05-18 LAB — CBC & DIFF AND RETIC
BASO%: 1.2 % (ref 0.0–2.0)
Basophils Absolute: 0.1 10*3/uL (ref 0.0–0.1)
EOS%: 1.4 % (ref 0.0–7.0)
Eosinophils Absolute: 0.1 10*3/uL (ref 0.0–0.5)
HCT: 34.9 % (ref 34.8–46.6)
HGB: 10.7 g/dL — ABNORMAL LOW (ref 11.6–15.9)
Immature Retic Fract: 7 % (ref 1.60–10.00)
LYMPH%: 48.5 % (ref 14.0–49.7)
MCH: 28.6 pg (ref 25.1–34.0)
MCHC: 30.7 g/dL — ABNORMAL LOW (ref 31.5–36.0)
MCV: 93.3 fL (ref 79.5–101.0)
MONO#: 0.4 10*3/uL (ref 0.1–0.9)
MONO%: 9.1 % (ref 0.0–14.0)
NEUT#: 1.7 10*3/uL (ref 1.5–6.5)
NEUT%: 39.8 % (ref 38.4–76.8)
Platelets: 268 10*3/uL (ref 145–400)
RBC: 3.74 10*6/uL (ref 3.70–5.45)
RDW: 13.7 % (ref 11.2–14.5)
Retic %: 1.05 % (ref 0.70–2.10)
Retic Ct Abs: 39.27 10*3/uL (ref 33.70–90.70)
WBC: 4.3 10*3/uL (ref 3.9–10.3)
lymph#: 2.1 10*3/uL (ref 0.9–3.3)

## 2013-05-18 LAB — FERRITIN CHCC: Ferritin: 13 ng/ml (ref 9–269)

## 2013-05-18 NOTE — Progress Notes (Signed)
[facility] OFFICE PROGRESS NOTE  Wells,ELIZABETH, MD DIAGNOSIS:  Chronic iron deficiency anemia  SUMMARY OF HEMATOLOGIC HISTORY: This is a pleasant 79 year old lady who is being referred here because of chronic anemia. The patient had negative colonoscopy in 2011 except for diverticular disease. On 11/29/2011, EGD and biopsy show chronic active gastritis and testing came back positive for H. pylori infection. She was placed on oral ion supplements but discontinued after resolution of anemia INTERVAL HISTORY: Judy Wells 79 y.o. female returns for return appointment. She complained of mild fatigue. The patient denies any recent signs or symptoms of bleeding such as spontaneous epistaxis, hematuria or hematochezia. She denies any NSAIDS ingestion Denies any recent heartburn or epigastric discomfort. No signs and symptoms of anemia part from mild fatigue I have reviewed the past medical history, past surgical history, social history and family history with the patient and they are unchanged from previous note.  ALLERGIES:  has No Known Allergies.  MEDICATIONS:  Current Outpatient Prescriptions  Medication Sig Dispense Refill  . alendronate (FOSAMAX) 70 MG tablet Take 70 mg by mouth once a week. Take with a full glass of water on an empty stomach.      . cholecalciferol (VITAMIN D) 1000 UNITS tablet Take 1,000 Units by mouth daily.      Marland Kitchen FLAXSEED, LINSEED, PO Take by mouth.      Marland Kitchen glipiZIDE (GLUCOTROL) 10 MG tablet Take 10 mg by mouth daily.      Marland Kitchen levothyroxine (SYNTHROID) 125 MCG tablet Take 125 mcg by mouth daily.      . metFORMIN (GLUCOPHAGE) 1000 MG tablet Take 1,000 mg by mouth daily with breakfast.      . omeprazole (PRILOSEC) 40 MG capsule Take 40 mg by mouth daily.      . fluconazole (DIFLUCAN) 200 MG tablet Take 1 tablet (200 mg total) by mouth daily.  3 tablet  0  . IRON PO Take by mouth. Slow release      . nystatin-triamcinolone ointment (MYCOLOG) Apply  topically 2 (two) times daily.  30 g  2   No current facility-administered medications for this visit.     REVIEW OF SYSTEMS:   Constitutional: Denies fevers, chills or night sweats Eyes: Denies blurriness of vision Ears, nose, mouth, throat, and face: Denies mucositis or sore throat Respiratory: Denies cough, dyspnea or wheezes Cardiovascular: Denies palpitation, chest discomfort or lower extremity swelling Gastrointestinal:  Denies nausea, heartburn or change in bowel habits Skin: Denies abnormal skin rashes Lymphatics: Denies new lymphadenopathy or easy bruising Neurological:Denies numbness, tingling or new weaknesses Behavioral/Psych: Mood is stable, no new changes  All other systems were reviewed with the patient and are negative.  PHYSICAL EXAMINATION: ECOG PERFORMANCE STATUS: 1 - Symptomatic but completely ambulatory  Filed Vitals:   05/18/13 1110  BP: 149/63  Pulse: 71  Temp: 98.2 F (36.8 C)  Resp: 20   Filed Weights   05/18/13 1110  Weight: 230 lb 11.2 oz (104.645 kg)    GENERAL:alert, no distress and comfortable. She is morbidly obese SKIN: skin color, texture, turgor are normal, no rashes or significant lesions EYES: normal, Conjunctiva are pink and non-injected, sclera clear OROPHARYNX:no exudate, no erythema and lips, buccal mucosa, and tongue normal  NECK: supple, thyroid normal size, non-tender, without nodularity LYMPH:  no palpable lymphadenopathy in the cervical, axillary or inguinal LUNGS: clear to auscultation and percussion with normal breathing effort HEART: regular rate & rhythm and no murmurs and no lower extremity edema ABDOMEN:abdomen soft,  non-tender and normal bowel sounds Musculoskeletal:no cyanosis of digits and no clubbing  NEURO: alert & oriented x 3 with fluent speech, no focal motor/sensory deficits  LABORATORY DATA:  I have reviewed the data as listed Results for orders placed in visit on 05/18/13 (from the past 48 hour(s))   FERRITIN [facility]     Status: None   Collection Time    05/18/13 10:56 AM      Result Value Range   Ferritin 13  9 - 269 ng/ml  CBC & DIFF AND RETIC     Status: Abnormal   Collection Time    05/18/13 10:56 AM      Result Value Range   WBC 4.3  3.9 - 10.3 10e3/uL   NEUT# 1.7  1.5 - 6.5 10e3/uL   HGB 10.7 (*) 11.6 - 15.9 g/dL   HCT 40.934.9  81.134.8 - 91.446.6 %   Platelets 268  145 - 400 10e3/uL   MCV 93.3  79.5 - 101.0 fL   MCH 28.6  25.1 - 34.0 pg   MCHC 30.7 (*) 31.5 - 36.0 g/dL   RBC 7.823.74  9.563.70 - 2.135.45 10e6/uL   RDW 13.7  11.2 - 14.5 %   lymph# 2.1  0.9 - 3.3 10e3/uL   MONO# 0.4  0.1 - 0.9 10e3/uL   Eosinophils Absolute 0.1  0.0 - 0.5 10e3/uL   Basophils Absolute 0.1  0.0 - 0.1 10e3/uL   NEUT% 39.8  38.4 - 76.8 %   LYMPH% 48.5  14.0 - 49.7 %   MONO% 9.1  0.0 - 14.0 %   EOS% 1.4  0.0 - 7.0 %   BASO% 1.2  0.0 - 2.0 %   Retic % 1.05  0.70 - 2.10 %   Retic Ct Abs 39.27  33.70 - 90.70 10e3/uL   Immature Retic Fract 7.00  1.60 - 10.00 %    Lab Results  Component Value Date   WBC 4.3 05/18/2013   HGB 10.7* 05/18/2013   HCT 34.9 05/18/2013   MCV 93.3 05/18/2013   PLT 268 05/18/2013   ASSESSMENT & PLAN:  #1 anemia This is likely combination of anemia chronic disease and mild iron deficiency. Her iron deficiency is likely related to possible recurrent GI bleed from gastritis/diverticular disease. I recommended she resume iron supplement one tablet a day and I'll see her back in 6 months for further follow-up #2 fatigue This is likely due to anemia but more so related to chronic hypothyroidism. Recommend observation only. All questions were answered. The patient knows to call the clinic with any problems, questions or concerns. No barriers to learning was detected.  I spent 15 minutes counseling the patient face to face. The total time spent in the appointment was 20 minutes and more than 50% was on counseling.     [facility] Health Uk[facility] ValleyGORSUCH, Judy Innocent, MD 05/18/2013 2:03 PM

## 2013-05-18 NOTE — Telephone Encounter (Signed)
gv and printed appt sched and avs fo rpt for July °

## 2013-06-09 ENCOUNTER — Encounter: Payer: Self-pay | Admitting: Gynecology

## 2013-06-11 ENCOUNTER — Encounter: Payer: Self-pay | Admitting: Gynecology

## 2013-06-11 ENCOUNTER — Ambulatory Visit (INDEPENDENT_AMBULATORY_CARE_PROVIDER_SITE_OTHER): Payer: Medicare Other | Admitting: Gynecology

## 2013-06-11 VITALS — BP 120/74 | Ht 62.0 in | Wt 229.0 lb

## 2013-06-11 DIAGNOSIS — M8580 Other specified disorders of bone density and structure, unspecified site: Secondary | ICD-10-CM

## 2013-06-11 DIAGNOSIS — M949 Disorder of cartilage, unspecified: Secondary | ICD-10-CM

## 2013-06-11 DIAGNOSIS — M899 Disorder of bone, unspecified: Secondary | ICD-10-CM

## 2013-06-11 DIAGNOSIS — N952 Postmenopausal atrophic vaginitis: Secondary | ICD-10-CM

## 2013-06-11 DIAGNOSIS — K429 Umbilical hernia without obstruction or gangrene: Secondary | ICD-10-CM

## 2013-06-11 NOTE — Progress Notes (Signed)
Judy Wells 1934-11-18 [id number]        79 y.o.  [id number] for annual exam.  Several issues noted below.  Past medical history,surgical history, problem list, medications, allergies, family history and social history were all reviewed and documented in the EPIC chart.  ROS:  Performed and pertinent positives and negatives are included in the history, assessment and plan .  Exam: Kim assistant Filed Vitals:   06/11/13 1448  BP: 120/74  Height: 5\' 2"  (1.575 m)  Weight: 229 lb (103.874 kg)   General appearance  Normal Skin grossly normal Head/Neck normal with no cervical or supraclavicular adenopathy thyroid normal Lungs  clear Cardiac RR, without RMG Abdominal  soft, nontender, without masses, organomegaly. Reducible umbilical hernia.  Breasts  examined lying and sitting without masses, retractions, discharge or axillary adenopathy. Pelvic  Ext/BUS/vagina  with atrophic changes.  Cervix  with atrophic changes.  Uterus  anteverted, normal size, shape and contour, midline and mobile nontender   Adnexa  Without masses or tenderness    Anus and perineum  Normal   Rectovaginal  Normal sphincter tone without palpated masses or tenderness. Postmenopausal    Assessment/Plan:  79 y.o. [id number] female for annual exam.   1. Postmenopausal/atrophic genital changes. Patient without significant symptoms of hot flushes, night sweats, vaginal dryness or any vaginal bleeding. Currently not sexually active. Will continue to monitor. Report any vaginal bleeding. 2. Osteopenia historically.  Have no copies of her bone densities. She is actively being followed by her primary physician for this. Currently on Fosamax. Tentatively has repeat bone density scheduled this year and she'll followup with her primary for this and management of her Fosamax. 3. Umbilical hernia. Stable for years. No evidence of incarceration historically. Will continue to monitor. 4. Mammography 01/2013. Continue with annual  mammography. SBE monthly reviewed. 5. Pap smear 2011. No Pap smear done today. No history of abnormal Pap smears previously. We have discussed this previously and decided to stop screening as she is over the age of 79 and comfortable with this. 6. Colonoscopy 2011. Repeat at their recommended interval. 7. Health maintenance. No blood work done and she reports this done through her primary physician's office. Followup one year, sooner as needed.    Note: This document was prepared with digital dictation and possible smart phrase technology. Any transcriptional errors that result from this process are unintentional.   Dara LordsFONTAINE,Jeniffer Culliver P MD, 3:33 PM 06/11/2013

## 2013-06-11 NOTE — Patient Instructions (Signed)
Follow up in one year, sooner as needed. 

## 2013-06-12 LAB — URINALYSIS W MICROSCOPIC + REFLEX CULTURE
Bacteria, UA: NONE SEEN
Bilirubin Urine: NEGATIVE
Casts: NONE SEEN
Crystals: NONE SEEN
Glucose, UA: NEGATIVE mg/dL
Ketones, ur: NEGATIVE mg/dL
Nitrite: NEGATIVE
Protein, ur: NEGATIVE mg/dL
Specific Gravity, Urine: 1.02 (ref 1.005–1.030)
Squamous Epithelial / LPF: NONE SEEN
Urobilinogen, UA: 0.2 mg/dL (ref 0.0–1.0)
pH: 5.5 (ref 5.0–8.0)

## 2013-06-13 LAB — URINE CULTURE

## 2013-07-22 ENCOUNTER — Encounter: Payer: Self-pay | Admitting: *Deleted

## 2013-07-28 ENCOUNTER — Ambulatory Visit (INDEPENDENT_AMBULATORY_CARE_PROVIDER_SITE_OTHER): Payer: Medicare Other | Admitting: Internal Medicine

## 2013-07-28 ENCOUNTER — Encounter: Payer: Self-pay | Admitting: Internal Medicine

## 2013-07-28 VITALS — BP 138/60 | HR 57 | Ht 62.0 in | Wt 227.4 lb

## 2013-07-28 DIAGNOSIS — R0600 Dyspnea, unspecified: Secondary | ICD-10-CM | POA: Insufficient documentation

## 2013-07-28 DIAGNOSIS — R0609 Other forms of dyspnea: Secondary | ICD-10-CM

## 2013-07-28 DIAGNOSIS — I456 Pre-excitation syndrome: Secondary | ICD-10-CM

## 2013-07-28 DIAGNOSIS — R0989 Other specified symptoms and signs involving the circulatory and respiratory systems: Secondary | ICD-10-CM

## 2013-07-28 DIAGNOSIS — R002 Palpitations: Secondary | ICD-10-CM

## 2013-07-28 DIAGNOSIS — K219 Gastro-esophageal reflux disease without esophagitis: Secondary | ICD-10-CM

## 2013-07-28 DIAGNOSIS — R0602 Shortness of breath: Secondary | ICD-10-CM

## 2013-07-28 DIAGNOSIS — D649 Anemia, unspecified: Secondary | ICD-10-CM

## 2013-07-28 DIAGNOSIS — Z79899 Other long term (current) drug therapy: Secondary | ICD-10-CM

## 2013-07-28 LAB — COMPREHENSIVE METABOLIC PANEL
ALT: 10 U/L (ref 0–35)
AST: 11 U/L (ref 0–37)
Albumin: 4 g/dL (ref 3.5–5.2)
Alkaline Phosphatase: 66 U/L (ref 39–117)
BUN: 11 mg/dL (ref 6–23)
CO2: 29 mEq/L (ref 19–32)
Calcium: 9 mg/dL (ref 8.4–10.5)
Chloride: 102 mEq/L (ref 96–112)
Creat: 0.63 mg/dL (ref 0.50–1.10)
Glucose, Bld: 169 mg/dL — ABNORMAL HIGH (ref 70–99)
Potassium: 4.8 mEq/L (ref 3.5–5.3)
Sodium: 137 mEq/L (ref 135–145)
Total Bilirubin: 0.8 mg/dL (ref 0.2–1.2)
Total Protein: 7.3 g/dL (ref 6.0–8.3)

## 2013-07-28 NOTE — Progress Notes (Signed)
OFFICE NOTE  Chief Complaint:  Shortness of breath  Primary Care Physician: Willow OraANDY,[name], MD  HPI:  Judy Wells is a 79 year old female who was briefly seen by Dr. Clarene DukeLittle years ago, was referred back to our office; however, Dr. Clarene DukeLittle is now retired. 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 Graciela HusbandsKlein after she suffered a cardiac arrest in [place] is from. Apparently had an MI at that time; however, we have no idea what her cardiac history is exactly, although she is listed as having a history of coronary disease. She also has recent history of GERD and gastritis and has been on treatment for H pylori and has diabetes and hypertension as well as a family history of heart disease. Recently, she has been having chest pressure which is possibly related to reflux. She admits that sometimes her symptoms are improved with jasmine tea.  At her last office visit she underwent stress testing in 2013 which was negative for ischemia and a preserved EF. Interestingly, she still has signs of preexcitation on her EKG. Today she has complaints of worsening shortness of breath. She has had about 15 pound weight gain since her last office visit in 2013. She reports that she occasionally has a cough and hasn't clear to whitish mucus. She says that her reflux symptoms are much better controlled on omeprazole however she is a little concerned this may be causing her shortness of breath.  She denies any orthopnea, PND or lower extremity edema.  PMHx:  Past Medical History  Diagnosis Date  . H. pylori infection 11/2011  . Hypothyroid   . GERD (gastroesophageal reflux disease)   . Anemia   . Diabetes mellitus type II   . HTN (hypertension)   . Hiatal hernia   . Osteopenia   . Osteoarthritis   . Vitamin D deficiency   . Coronary artery disease 1980's  . Wolf-Parkinson-White syndrome 1999    s/p ablation in 1997 (Dr. Odessa FlemingS. Klein) - after cardiac arrest while in  [place]  . Diverticulosis 2011  . Myocardial infarction     secondary to WPW    Past Surgical History  Procedure Laterality Date  . Appendectomy    . Abdominal adhesion surgery    . Colonoscopy    . Breast surgery      breast lump removed - tiny  . Nm myocar perf wall motion  01/2012    lexiscan myoview - normal pattern of perfusion in all regions, EF 64%  . Ablation  05/19/1997    Dr. Graciela HusbandsKlein - bidirectional conduction of right posterolateral AT - RF ablation  . Cardiac catheterization  02/19/1997    noraml L main, LAD free of disease, diagonal with no obstructions, L Cfx free of disease (along with OM & PDA being normal), RCA small & non-dominant & free of disease (Dr. Langston ReusingA. Little)    FAMHx:  Family History  Problem Relation Age of Onset  . Dementia Mother   . Heart disease Cousin   . Stroke Father   . Stroke Maternal Grandmother     SOCHx:   reports that she has never smoked. She has never used smokeless tobacco. She reports that she drinks alcohol. She reports that she does not use illicit drugs.  ALLERGIES:  No Known Allergies  ROS: A comprehensive review of systems was negative except for: Constitutional: positive for weight gain Respiratory: positive for cough and dyspnea on exertion  HOME  MEDS: Current Outpatient Prescriptions  Medication Sig Dispense Refill  . cholecalciferol (VITAMIN D) 1000 UNITS tablet Take 1,000 Units by mouth daily.      Marland Kitchen FLAXSEED, LINSEED, PO Take by mouth.      Marland Kitchen glipiZIDE (GLUCOTROL) 10 MG tablet Take 10 mg by mouth daily.      . IRON PO Take by mouth. Slow release      . levothyroxine (SYNTHROID, LEVOTHROID) 100 MCG tablet Take 100 mcg by mouth daily before breakfast.      . metFORMIN (GLUCOPHAGE) 1000 MG tablet Take 1,000 mg by mouth daily with breakfast.      . nystatin-triamcinolone ointment (MYCOLOG) Apply topically 2 (two) times daily.  30 g  2  . omeprazole (PRILOSEC) 40 MG capsule Take 40 mg by mouth daily as needed.        No  current facility-administered medications for this visit.    LABS/IMAGING: No results found for this or any previous visit (from the past 48 hour(s)). No results found.  VITALS: BP 138/60  Pulse 57  Ht 5\' 2"  (1.575 m)  Wt 227 lb 6.4 oz (103.148 kg)  BMI 41.58 kg/m2  EXAM: General appearance: alert and no distress Neck: no carotid bruit, no JVD and thyroid not enlarged, symmetric, no tenderness/mass/nodules Lungs: diminished breath sounds bilaterally Heart: regular rate and rhythm, S1, S2 normal, no murmur, click, rub or gallop Abdomen: morbidly obese, protuberant Extremities: extremities normal, atraumatic, no cyanosis or edema Pulses: 2+ and symmetric Skin: Skin color, texture, turgor normal. No rashes or lesions Neurologic: Grossly normal Psych: Pleasant  EKG: Normal sinus rhythm at 70, delta waves diagnostic of WPW  ASSESSMENT: 1. New, progressive shortness of breath with exertion 2. History of WPW status post ablation 3. DM2 4. GERD  PLAN: 1.   [name] is describing new onset shortness of breath which is worsening with exertion. She denies any typical heart failure symptoms but has had weight gain although does not have any evidence of fluid retention on exam. I would like to check an echocardiogram to rule out a cardiomyopathy. Will also check a CMP and BNP. If her laboratory work and echo are fairly normal, would recommend pulmonary function testing. I suspect she has some element of restriction due to her weight gain which is probably contributing to her symptoms. This does not however explain the whitish sputum that she says she has.  I will contact her with the results of her echo and lab work and further testing will be based on this.  Chrystie Nose, MD, [facility] Attending Cardiologist [facility]  HILTY,Kenneth C 07/28/2013, 8:55 AM

## 2013-07-28 NOTE — Patient Instructions (Signed)
Your physician has requested that you have an echocardiogram. Echocardiography is a painless test that uses sound waves to create images of your heart. It provides your doctor with information about the size and shape of your heart and how well your heart's chambers and valves are working. This procedure takes approximately one hour. There are no restrictions for this procedure.  Your physician recommends that you return for lab work TODAY  We will call you with the results.   Your physician wants you to follow-up in: 1 year with Dr. Rennis GoldenHilty. You will receive a reminder letter in the mail two months in advance. If you don't receive a letter, please call our office to schedule the follow-up appointment.

## 2013-07-29 LAB — BRAIN NATRIURETIC PEPTIDE: Brain Natriuretic Peptide: 43 pg/mL (ref 0.0–100.0)

## 2013-08-05 ENCOUNTER — Ambulatory Visit (HOSPITAL_COMMUNITY)
Admission: RE | Admit: 2013-08-05 | Discharge: 2013-08-05 | Disposition: A | Discharge status: Home / Self Care | Payer: Medicare Other | Source: Ambulatory Visit | Attending: Cardiovascular Disease | Admitting: Cardiovascular Disease

## 2013-08-05 DIAGNOSIS — I10 Essential (primary) hypertension: Secondary | ICD-10-CM | POA: Insufficient documentation

## 2013-08-05 DIAGNOSIS — I059 Rheumatic mitral valve disease, unspecified: Secondary | ICD-10-CM

## 2013-08-05 DIAGNOSIS — R0609 Other forms of dyspnea: Secondary | ICD-10-CM | POA: Insufficient documentation

## 2013-08-05 DIAGNOSIS — R002 Palpitations: Secondary | ICD-10-CM

## 2013-08-05 DIAGNOSIS — R0989 Other specified symptoms and signs involving the circulatory and respiratory systems: Secondary | ICD-10-CM | POA: Insufficient documentation

## 2013-08-05 DIAGNOSIS — R0602 Shortness of breath: Secondary | ICD-10-CM

## 2013-08-05 DIAGNOSIS — I456 Pre-excitation syndrome: Secondary | ICD-10-CM

## 2013-08-05 NOTE — Progress Notes (Signed)
2D Echo Performed 08/05/2013    Elyzabeth Goatley, RCS  

## 2013-08-21 ENCOUNTER — Telehealth: Payer: Self-pay | Admitting: Internal Medicine

## 2013-08-21 NOTE — Telephone Encounter (Signed)
Emma at Dr. Modesta MessingAndy's office called.  Stated pt is there now and stated Dr. Rennis GoldenHilty wanted her to be referred to a pulmonologist, but didn't know why.  RN reviewed notes and read plan from Dr. Blanchie DessertHilty's note on 4.7.15 as well as lab/echo results.  Informed all normal and per Dr. Blanchie DessertHilty's plan, pt should have PFTs.  Emma verbalized understanding.

## 2013-11-16 ENCOUNTER — Ambulatory Visit (HOSPITAL_BASED_OUTPATIENT_CLINIC_OR_DEPARTMENT_OTHER): Payer: Medicare Other | Admitting: Hematology and Oncology

## 2013-11-16 ENCOUNTER — Encounter: Payer: Self-pay | Admitting: Hematology and Oncology

## 2013-11-16 ENCOUNTER — Other Ambulatory Visit (HOSPITAL_BASED_OUTPATIENT_CLINIC_OR_DEPARTMENT_OTHER): Payer: Medicare Other

## 2013-11-16 VITALS — BP 139/81 | HR 72 | Temp 98.1°F | Resp 20 | Ht 62.0 in | Wt 230.4 lb

## 2013-11-16 DIAGNOSIS — D638 Anemia in other chronic diseases classified elsewhere: Secondary | ICD-10-CM

## 2013-11-16 DIAGNOSIS — R070 Pain in throat: Secondary | ICD-10-CM

## 2013-11-16 DIAGNOSIS — D649 Anemia, unspecified: Secondary | ICD-10-CM

## 2013-11-16 LAB — CBC & DIFF AND RETIC
BASO%: 0.7 % (ref 0.0–2.0)
Basophils Absolute: 0 10*3/uL (ref 0.0–0.1)
EOS%: 1.7 % (ref 0.0–7.0)
Eosinophils Absolute: 0.1 10*3/uL (ref 0.0–0.5)
HCT: 34.7 % — ABNORMAL LOW (ref 34.8–46.6)
HGB: 11.1 g/dL — ABNORMAL LOW (ref 11.6–15.9)
Immature Retic Fract: 6.7 % (ref 1.60–10.00)
LYMPH%: 45 % (ref 14.0–49.7)
MCH: 30 pg (ref 25.1–34.0)
MCHC: 32 g/dL (ref 31.5–36.0)
MCV: 93.8 fL (ref 79.5–101.0)
MONO#: 0.3 10*3/uL (ref 0.1–0.9)
MONO%: 6.9 % (ref 0.0–14.0)
NEUT#: 1.9 10*3/uL (ref 1.5–6.5)
NEUT%: 45.7 % (ref 38.4–76.8)
Platelets: 263 10*3/uL (ref 145–400)
RBC: 3.7 10*6/uL (ref 3.70–5.45)
RDW: 13.9 % (ref 11.2–14.5)
Retic %: 1.79 % (ref 0.70–2.10)
Retic Ct Abs: 66.23 10*3/uL (ref 33.70–90.70)
WBC: 4.1 10*3/uL (ref 3.9–10.3)
lymph#: 1.8 10*3/uL (ref 0.9–3.3)

## 2013-11-16 LAB — SEDIMENTATION RATE: Sed Rate: 6 mm/hr (ref 0–22)

## 2013-11-16 LAB — FERRITIN CHCC: Ferritin: 34 ng/ml (ref 9–269)

## 2013-11-16 NOTE — Assessment & Plan Note (Signed)
I suspect she has chronic reflux/gastritis. The patient is taking a proton pump inhibitor.

## 2013-11-16 NOTE — Progress Notes (Signed)
[facility] OFFICE PROGRESS NOTE  Judy,[name], MD SUMMARY OF HEMATOLOGIC HISTORY: This is a pleasant 79 year old lady who is being referred here because of chronic anemia. The patient had negative colonoscopy in 2011 except for diverticular disease. On 11/29/2011, EGD and biopsy show chronic active gastritis and testing came back positive for H. pylori infection. This to chronic active gastritis, alendronate was discontinued. She was placed on oral iron replacement therapy. INTERVAL HISTORY: Judy Wells 79 y.o. female returns for followup of anemia. She continues to feel fatigued. She had loccasional throat pain and heartburn. The patient denies any recent signs or symptoms of bleeding such as spontaneous epistaxis, hematuria or hematochezia.  I have reviewed the past medical history, past surgical history, social history and family history with the patient and they are unchanged from previous note.  ALLERGIES:  is allergic to atorvastatin.  MEDICATIONS:  Current Outpatient Prescriptions  Medication Sig Dispense Refill  . aspirin 81 MG tablet Take 81 mg by mouth.      . Calcium Carbonate-Vitamin D 600-400 MG-UNIT per tablet Take 1 tablet by mouth.      . cholecalciferol (VITAMIN D) 1000 UNITS tablet Take 1,000 Units by mouth daily.      Marland Kitchen Fe Cbn-Fe Gluc-FA-B12-C-DSS (FERRALET 90) 90-1 MG TABS 1 tablet once a day      . fexofenadine (ALLEGRA) 180 MG tablet Take 180 mg by mouth.      Marland Kitchen FLAXSEED, LINSEED, PO Take by mouth.      . fluticasone (FLONASE) 50 MCG/ACT nasal spray Place 1 spray into the nose.      Marland Kitchen glipiZIDE (GLUCOTROL) 10 MG tablet Take 10 mg by mouth daily.      . IRON PO Take by mouth. Slow release      . levothyroxine (SYNTHROID, LEVOTHROID) 100 MCG tablet Take 100 mcg by mouth daily before breakfast.      . metFORMIN (GLUCOPHAGE) 1000 MG tablet Take 1,000 mg by mouth 2 (two) times daily with a meal.       . montelukast (SINGULAIR) 10 MG tablet Take 10  mg by mouth.      . nystatin-triamcinolone ointment (MYCOLOG) Apply topically 2 (two) times daily.  30 g  2  . omeprazole (PRILOSEC) 40 MG capsule Take 40 mg by mouth daily as needed.        No current facility-administered medications for this visit.     REVIEW OF SYSTEMS:   Constitutional: Denies fevers, chills or night sweats Eyes: Denies blurriness of vision Ears, nose, mouth, throat, and face: Denies mucositis or sore throat Respiratory: Denies cough, dyspnea or wheezes Cardiovascular: Denies palpitation, chest discomfort or lower extremity swelling Skin: Denies abnormal skin rashes Lymphatics: Denies new lymphadenopathy or easy bruising Neurological:Denies numbness, tingling or new weaknesses Behavioral/Psych: Mood is stable, no new changes  All other systems were reviewed with the patient and are negative.  PHYSICAL EXAMINATION: ECOG PERFORMANCE STATUS: 1 - Symptomatic but completely ambulatory  Filed Vitals:   11/16/13 1218  BP: 139/81  Pulse: 72  Temp: 98.1 F (36.7 C)  Resp: 20   Filed Weights   11/16/13 1218  Weight: 230 lb 6.4 oz (104.509 kg)    GENERAL:alert, no distress and comfortable SKIN: skin color, texture, turgor are normal, no rashes or significant lesions EYES: normal, Conjunctiva are pink and non-injected, sclera clear Musculoskeletal:no cyanosis of digits and no clubbing  NEURO: alert & oriented x 3 with fluent speech, no focal motor/sensory deficits  LABORATORY DATA:  I have reviewed the data as listed Results for orders placed in visit on 11/16/13 (from the past 48 hour(s))  CBC & DIFF AND RETIC     Status: Abnormal   Collection Time    11/16/13 12:07 PM      Result Value Ref Range   WBC 4.1  3.9 - 10.3 10e3/uL   NEUT# 1.9  1.5 - 6.5 10e3/uL   HGB 11.1 (*) 11.6 - 15.9 g/dL   HCT 16.134.7 (*) 09.634.8 - 04.546.6 %   Platelets 263  145 - 400 10e3/uL   MCV 93.8  79.5 - 101.0 fL   MCH 30.0  25.1 - 34.0 pg   MCHC 32.0  31.5 - 36.0 g/dL   RBC 4.093.70  8.113.70  - 9.145.45 10e6/uL   RDW 13.9  11.2 - 14.5 %   lymph# 1.8  0.9 - 3.3 10e3/uL   MONO# 0.3  0.1 - 0.9 10e3/uL   Eosinophils Absolute 0.1  0.0 - 0.5 10e3/uL   Basophils Absolute 0.0  0.0 - 0.1 10e3/uL   NEUT% 45.7  38.4 - 76.8 %   LYMPH% 45.0  14.0 - 49.7 %   MONO% 6.9  0.0 - 14.0 %   EOS% 1.7  0.0 - 7.0 %   BASO% 0.7  0.0 - 2.0 %   Retic % 1.79  0.70 - 2.10 %   Retic Ct Abs 66.23  33.70 - 90.70 10e3/uL   Immature Retic Fract 6.70  1.60 - 10.00 %  FERRITIN [facility]     Status: None   Collection Time    11/16/13 12:07 PM      Result Value Ref Range   Ferritin 34  9 - 269 ng/ml    Lab Results  Component Value Date   WBC 4.1 11/16/2013   HGB 11.1* 11/16/2013   HCT 34.7* 11/16/2013   MCV 93.8 11/16/2013   PLT 263 11/16/2013   ASSESSMENT & PLAN:  Anemia in chronic illness I suspect the cause of her current anemia is due to anemia of chronic disease. Her iron studies suggest a borderline iron deficiency. She is doing well with current multivitamin supplements. I recommend she continue same and to follow with primary care provider in the future for yearly blood work. I would discharge her from the clinic.  Throat discomfort I suspect she has chronic reflux/gastritis. The patient is taking a proton pump inhibitor.      All questions were answered. The patient knows to call the clinic with any problems, questions or concerns. No barriers to learning was detected.  I spent 15 minutes counseling the patient face to face. The total time spent in the appointment was 20 minutes and more than 50% was on counseling.     [facility] Surgery Center LLCGORSUCH, Shanele Nissan, MD 11/16/2013 9:17 PM

## 2013-11-16 NOTE — Assessment & Plan Note (Signed)
I suspect the cause of her current anemia is due to anemia of chronic disease. Her iron studies suggest a borderline iron deficiency. She is doing well with current multivitamin supplements. I recommend she continue same and to follow with primary care provider in the future for yearly blood work. I would discharge her from the clinic.

## 2014-02-22 ENCOUNTER — Encounter: Payer: Self-pay | Admitting: Hematology and Oncology

## 2014-04-09 ENCOUNTER — Telehealth: Payer: Self-pay | Admitting: Internal Medicine

## 2014-04-09 NOTE — Telephone Encounter (Signed)
Judy SagoSarah is calling in stating that the pt is on the MRI table and the pt is stating that she had some type of surgery on her neck back in 2000 and she wanted to make sure that this would not interfere with her MRI. Please call back ASAP  Thanks

## 2014-04-09 NOTE — Telephone Encounter (Signed)
I'm not aware of any reason should could not have an MRI.  Dr. HRexene Edison

## 2014-04-09 NOTE — Telephone Encounter (Signed)
Notified caller from [facility], OF Dr Rennis GoldenHILTY  ANSWER SHE VERBALIZED UNDERSTANDING.

## 2014-04-09 NOTE — Telephone Encounter (Signed)
Forward to dr Rennis Goldenhilty

## 2014-08-16 ENCOUNTER — Encounter: Payer: Self-pay | Admitting: Internal Medicine

## 2014-08-16 ENCOUNTER — Ambulatory Visit (INDEPENDENT_AMBULATORY_CARE_PROVIDER_SITE_OTHER): Payer: Medicare Other | Admitting: Internal Medicine

## 2014-08-16 VITALS — BP 120/68 | HR 74 | Ht 63.0 in | Wt 223.0 lb

## 2014-08-16 DIAGNOSIS — R0609 Other forms of dyspnea: Secondary | ICD-10-CM | POA: Diagnosis not present

## 2014-08-16 DIAGNOSIS — E038 Other specified hypothyroidism: Secondary | ICD-10-CM

## 2014-08-16 DIAGNOSIS — E119 Type 2 diabetes mellitus without complications: Secondary | ICD-10-CM

## 2014-08-16 DIAGNOSIS — I456 Pre-excitation syndrome: Secondary | ICD-10-CM | POA: Diagnosis not present

## 2014-08-16 DIAGNOSIS — R0600 Dyspnea, unspecified: Secondary | ICD-10-CM

## 2014-08-16 MED ORDER — PRAVASTATIN SODIUM 20 MG PO TABS: 20.0000 mg | ORAL_TABLET | Freq: Every evening | ORAL | Status: DC

## 2014-08-16 NOTE — Patient Instructions (Addendum)
Your physician wants you to follow-up in: 1 year with Dr. Rennis GoldenHilty. You will receive a reminder letter in the mail two months in advance. If you don't receive a letter, please call our office to schedule the follow-up appointment.  Your physician has recommended you make the following change in your medication: START pravastatin 20mg  once daily for cholesterol

## 2014-08-17 DIAGNOSIS — E039 Hypothyroidism, unspecified: Secondary | ICD-10-CM | POA: Insufficient documentation

## 2014-08-17 DIAGNOSIS — E119 Type 2 diabetes mellitus without complications: Secondary | ICD-10-CM | POA: Insufficient documentation

## 2014-08-17 NOTE — Progress Notes (Signed)
OFFICE NOTE  Chief Complaint:  Shortness of breath  Primary Care Physician: Willow Ora, MD  HPI:  Judy Wells is a 80 year old female who was briefly seen by Dr. Clarene Duke years ago, was referred back to our office; however, Dr. Clarene Duke is now retired. 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 Graciela Husbands after she suffered a cardiac arrest in [place], which is where she is from. Apparently had an MI at that time; however, we have no idea what her cardiac history is exactly, although she is listed as having a history of coronary disease. She also has recent history of GERD and gastritis and has been on treatment for H pylori and has diabetes and hypertension as well as a family history of heart disease. Recently, she has been having chest pressure which is possibly related to reflux. She admits that sometimes her symptoms are improved with jasmine tea.  At her last office visit she underwent stress testing in 2013 which was negative for ischemia and a preserved EF. Interestingly, she still has signs of preexcitation on her EKG. Today she has complaints of worsening shortness of breath. She has had about 15 pound weight gain since her last office visit in 2013. She reports that she occasionally has a cough and hasn't clear to whitish mucus. She says that her reflux symptoms are much better controlled on omeprazole however she is a little concerned this may be causing her shortness of breath.  She denies any orthopnea, PND or lower extremity edema.  I had the pleasure seeing Miss Judy Wells see back in the office today. Her chief complaint is some worsening shortness of breath with exertion. Seems to be doing activities such as making her bed. She is also recently had some tremor in her left hand however this is known by her primary care provider and has been addressed. She denies any chest pain.  PMHx:  Past Medical History  Diagnosis Date  . H. pylori infection 11/2011  .  Hypothyroid   . GERD (gastroesophageal reflux disease)   . Anemia   . Diabetes mellitus type II   . HTN (hypertension)   . Hiatal hernia   . Osteopenia   . Osteoarthritis   . Vitamin D deficiency   . Coronary artery disease 1980's  . Wolf-Parkinson-White syndrome 1999    s/p ablation in 1997 (Dr. Odessa Fleming) - after cardiac arrest while in [place]  . Diverticulosis 2011  . Myocardial infarction     secondary to WPW    Past Surgical History  Procedure Laterality Date  . Appendectomy    . Abdominal adhesion surgery    . Colonoscopy    . Breast surgery      breast lump removed - tiny  . Nm myocar perf wall motion  01/2012    lexiscan myoview - normal pattern of perfusion in all regions, EF 64%  . Ablation  05/19/1997    Dr. Graciela Husbands - bidirectional conduction of right posterolateral AT - RF ablation  . Cardiac catheterization  02/19/1997    noraml L main, LAD free of disease, diagonal with no obstructions, L Cfx free of disease (along with OM & PDA being normal), RCA small & non-dominant & free of disease (Dr. Langston Reusing)    FAMHx:  Family History  Problem Relation Age of Onset  . Dementia Mother   . Heart disease Cousin   . Stroke Father   . Stroke Maternal Grandmother  SOCHx:   reports that she has never smoked. She has never used smokeless tobacco. She reports that she drinks alcohol. She reports that she does not use illicit drugs.  ALLERGIES:  Allergies  Allergen Reactions  . Atorvastatin Other (See Comments)    Pain and falls    ROS: A comprehensive review of systems was negative except for: Constitutional: positive for weight gain Respiratory: positive for cough and dyspnea on exertion  HOME MEDS: Current Outpatient Prescriptions  Medication Sig Dispense Refill  . aspirin 81 MG tablet Take 81 mg by mouth.    . Calcium Carbonate-Vitamin D 600-400 MG-UNIT per tablet Take 1 tablet by mouth.    . cholecalciferol (VITAMIN D) 1000 UNITS tablet Take 1,000 Units  by mouth daily.    Marland Kitchen. FLAXSEED, LINSEED, PO Take by mouth.    Marland Kitchen. glipiZIDE (GLUCOTROL) 10 MG tablet Take 10 mg by mouth daily.    . IRON PO Take by mouth. Slow release    . levothyroxine (SYNTHROID, LEVOTHROID) 100 MCG tablet Take 100 mcg by mouth daily before breakfast.    . metFORMIN (GLUCOPHAGE) 1000 MG tablet Take 1,000 mg by mouth 2 (two) times daily with a meal.     . montelukast (SINGULAIR) 10 MG tablet Take 10 mg by mouth.    . pravastatin (PRAVACHOL) 20 MG tablet Take 1 tablet (20 mg total) by mouth every evening. 90 tablet 3   No current facility-administered medications for this visit.    LABS/IMAGING: No results found for this or any previous visit (from the past 48 hour(s)). No results found.  VITALS: BP 120/68 mmHg  Pulse 74  Ht 5\' 3"  (1.6 m)  Wt 223 lb (101.152 kg)  BMI 39.51 kg/m2  EXAM: General appearance: alert and no distress Neck: no carotid bruit, no JVD and thyroid not enlarged, symmetric, no tenderness/mass/nodules Lungs: diminished breath sounds bilaterally Heart: regular rate and rhythm, S1, S2 normal, no murmur, click, rub or gallop Abdomen: morbidly obese, protuberant Extremities: extremities normal, atraumatic, no cyanosis or edema Pulses: 2+ and symmetric Skin: Skin color, texture, turgor normal. No rashes or lesions Neurologic: Grossly normal Psych: Pleasant  EKG: Normal sinus rhythm at 70, delta waves diagnostic of WPW  ASSESSMENT: 1. New, progressive shortness of breath with exertion 2. History of WPW status post ablation 3. DM2 4. GERD  PLAN: 1.   [name] is again describing shortness of breath with exertion. She's perceives this is somewhat different than her previous episodes of shortness of breath. She last underwent a nuclear stress test for evaluation of this which was negative. She's had no palpitations or anything concerning for recurrent WPW. I don't believe her shortness of breath is any worse than usual. She has had some weight  gain and I think this is the main cause of her symptoms. There are no physical findings concerning for heart failure. Recently she had lab work which indicated total cholesterol 178, triglycerides 65, HDL 75 and LDL 90. It was recommended she start on low-dose Crestor however the prescription would cost her about $200. She did not fill that prescription. Based on her prior intolerance to atorvastatin, I recommend placing her on pravastatin 20 mg daily. This should get her to goal LDL less than 70 which is recommended for diabetics.   Plan to see her back annually or sooner as necessary.   Chrystie NoseKenneth C. Oree Hislop, MD, [facility] CenterFACC Attending Cardiologist [facility]  Rayel Santizo C 08/17/2014, 9:12 AM

## 2014-09-08 ENCOUNTER — Ambulatory Visit (INDEPENDENT_AMBULATORY_CARE_PROVIDER_SITE_OTHER): Payer: Medicare Other | Admitting: Gynecology

## 2014-09-08 ENCOUNTER — Encounter: Payer: Self-pay | Admitting: Gynecology

## 2014-09-08 VITALS — BP 116/74 | Ht 62.0 in | Wt 223.0 lb

## 2014-09-08 DIAGNOSIS — K429 Umbilical hernia without obstruction or gangrene: Secondary | ICD-10-CM | POA: Diagnosis not present

## 2014-09-08 DIAGNOSIS — N952 Postmenopausal atrophic vaginitis: Secondary | ICD-10-CM | POA: Diagnosis not present

## 2014-09-08 DIAGNOSIS — Z01419 Encounter for gynecological examination (general) (routine) without abnormal findings: Secondary | ICD-10-CM | POA: Diagnosis not present

## 2014-09-08 NOTE — Progress Notes (Signed)
Judy Wells [birth year] [id number]        80 y.o.  [id number] for breast and pelvic exam. Several issues noted below.  Past medical history,surgical history, problem list, medications, allergies, family history and social history were all reviewed and documented as reviewed in the EPIC chart.  ROS:  Performed with pertinent positives and negatives included in the history, assessment and plan.   Additional significant findings :  none   Exam: [facility] personassistant Filed Vitals:   09/08/14 1113  BP: 116/74  Height: 5\' 2"  (1.575 m)  Weight: 223 lb (101.152 kg)   General appearance:  Normal affect, orientation and appearance. Skin: Grossly normal HEENT: Without gross lesions.  No cervical or supraclavicular adenopathy. Thyroid normal.  Lungs:  Clear without wheezing, rales or rhonchi Cardiac: RR, without RMG Abdominal:  Soft, nontender, without masses, guarding, rebound, organomegaly. Finger with easily reducible umbilical hernia. Breasts:  Examined lying and sitting without masses, retractions, discharge or axillary adenopathy. Pelvic:  Ext/BUS/vagina with generalized atrophic changes  Cervix with atrophic changes  Uterus unable to palpate but no gross masses or tenderness.  Adnexa  Without gross masses or tenderness    Anus and perineum  Normal   Rectovaginal  Normal sphincter tone without palpated masses or tenderness.    Assessment/Plan:  80 y.o. [name] female for breast and pelvic exam.   1. Postmenopausal/atrophic genital changes. Patient doing well without significant hot flushes, night sweats, vaginal dryness or any vaginal bleeding. Continue monitoring for any vaginal bleeding. 2. Osteopenia/osteoporosis. Being followed by her primary physician. Recently switched from Fosamax to Prolia. I do not have any copies of her bone studies and she'll continue to follow up with her primary physician in reference to this. 3. Mammography coming due in July and I reminded her to schedule this  and she agrees to do so. SBE monthly reviewed. 4. Pap smear 2011. No Pap smear done today. No history of abnormal Pap smears previously. We have discussed this previously and we both agree to stop screening as she is over the age of 80 per current screening guidelines. 5. Colonoscopy 2011. Repeat at their recommended interval. 6. Umbilical hernia. Present for years. Not bothersome to the patient. Incarceration signs and symptoms reviewed. 7. Health maintenance. No routine blood work done as this is done at her primary physician's office. Follow up in one year, sooner as needed.     Dara LordsFONTAINE,TIMOTHY P MD, 11:44 AM 09/08/2014

## 2014-09-08 NOTE — Patient Instructions (Signed)
You may obtain a copy of any labs that were done today by logging onto MyChart as outlined in the instructions provided with your AVS (after visit summary). The office will not call with normal lab results but certainly if there are any significant abnormalities then we will contact you.   Health Maintenance, Female A healthy lifestyle and preventative care can promote health and wellness.  Maintain regular health, dental, and eye exams.  Eat a healthy diet. Foods like vegetables, fruits, whole grains, low-fat dairy products, and lean protein foods contain the nutrients you need without too many calories. Decrease your intake of foods high in solid fats, added sugars, and salt. Get information about a proper diet from your caregiver, if necessary.  Regular physical exercise is one of the most important things you can do for your health. Most adults should get at least 150 minutes of moderate-intensity exercise (any activity that increases your heart rate and causes you to sweat) each week. In addition, most adults need muscle-strengthening exercises on 2 or more days a week.   Maintain a healthy weight. The body mass index (BMI) is a screening tool to identify possible weight problems. It provides an estimate of body fat based on height and weight. Your caregiver can help determine your BMI, and can help you achieve or maintain a healthy weight. For adults 20 years and older:  A BMI below 18.5 is considered underweight.  A BMI of 18.5 to 24.9 is normal.  A BMI of 25 to 29.9 is considered overweight.  A BMI of 30 and above is considered obese.  Maintain normal blood lipids and cholesterol by exercising and minimizing your intake of saturated fat. Eat a balanced diet with plenty of fruits and vegetables. Blood tests for lipids and cholesterol should begin at age 61 and be repeated every 5 years. If your lipid or cholesterol levels are high, you are over 50, or you are a high risk for heart  disease, you may need your cholesterol levels checked more frequently.Ongoing high lipid and cholesterol levels should be treated with medicines if diet and exercise are not effective.  If you smoke, find out from your caregiver how to quit. If you do not use tobacco, do not start.  Lung cancer screening is recommended for adults aged 33 80 years who are at high risk for developing lung cancer because of a history of smoking. Yearly low-dose computed tomography (CT) is recommended for people who have at least a 30-pack-year history of smoking and are a current smoker or have quit within the past 15 years. A pack year of smoking is smoking an average of 1 pack of cigarettes a day for 1 year (for example: 1 pack a day for 30 years or 2 packs a day for 15 years). Yearly screening should continue until the smoker has stopped smoking for at least 15 years. Yearly screening should also be stopped for people who develop a health problem that would prevent them from having lung cancer treatment.  If you are pregnant, do not drink alcohol. If you are breastfeeding, be very cautious about drinking alcohol. If you are not pregnant and choose to drink alcohol, do not exceed 1 drink per day. One drink is considered to be 12 ounces (355 mL) of beer, 5 ounces (148 mL) of wine, or 1.5 ounces (44 mL) of liquor.  Avoid use of street drugs. Do not share needles with anyone. Ask for help if you need support or instructions about stopping  the use of drugs.  High blood pressure causes heart disease and increases the risk of stroke. Blood pressure should be checked at least every 1 to 2 years. Ongoing high blood pressure should be treated with medicines, if weight loss and exercise are not effective.  If you are 59 to 78 years old, ask your caregiver if you should take aspirin to prevent strokes.  Diabetes screening involves taking a blood sample to check your fasting blood sugar level. This should be done once every 3  years, after age 91, if you are within normal weight and without risk factors for diabetes. Testing should be considered at a younger age or be carried out more frequently if you are overweight and have at least 1 risk factor for diabetes.  Breast cancer screening is essential preventative care for women. You should practice "breast self-awareness." This means understanding the normal appearance and feel of your breasts and may include breast self-examination. Any changes detected, no matter how small, should be reported to a caregiver. Women in their 66s and 30s should have a clinical breast exam (CBE) by a caregiver as part of a regular health exam every 1 to 3 years. After age 101, women should have a CBE every year. Starting at age 100, women should consider having a mammogram (breast X-ray) every year. Women who have a family history of breast cancer should talk to their caregiver about genetic screening. Women at a high risk of breast cancer should talk to their caregiver about having an MRI and a mammogram every year.  Breast cancer gene (BRCA)-related cancer risk assessment is recommended for women who have family members with BRCA-related cancers. BRCA-related cancers include breast, ovarian, tubal, and peritoneal cancers. Having family members with these cancers may be associated with an increased risk for harmful changes (mutations) in the breast cancer genes BRCA1 and BRCA2. Results of the assessment will determine the need for genetic counseling and BRCA1 and BRCA2 testing.  The Pap test is a screening test for cervical cancer. Women should have a Pap test starting at age 57. Between ages 25 and 35, Pap tests should be repeated every 2 years. Beginning at age 37, you should have a Pap test every 3 years as long as the past 3 Pap tests have been normal. If you had a hysterectomy for a problem that was not cancer or a condition that could lead to cancer, then you no longer need Pap tests. If you are  between ages 50 and 76, and you have had normal Pap tests going back 10 years, you no longer need Pap tests. If you have had past treatment for cervical cancer or a condition that could lead to cancer, you need Pap tests and screening for cancer for at least 20 years after your treatment. If Pap tests have been discontinued, risk factors (such as a new sexual partner) need to be reassessed to determine if screening should be resumed. Some women have medical problems that increase the chance of getting cervical cancer. In these cases, your caregiver may recommend more frequent screening and Pap tests.  The human papillomavirus (HPV) test is an additional test that may be used for cervical cancer screening. The HPV test looks for the virus that can cause the cell changes on the cervix. The cells collected during the Pap test can be tested for HPV. The HPV test could be used to screen women aged 44 years and older, and should be used in women of any age  who have unclear Pap test results. After the age of 55, women should have HPV testing at the same frequency as a Pap test.  Colorectal cancer can be detected and often prevented. Most routine colorectal cancer screening begins at the age of 44 and continues through age 20. However, your caregiver may recommend screening at an earlier age if you have risk factors for colon cancer. On a yearly basis, your caregiver may provide home test kits to check for hidden blood in the stool. Use of a small camera at the end of a tube, to directly examine the colon (sigmoidoscopy or colonoscopy), can detect the earliest forms of colorectal cancer. Talk to your caregiver about this at age 86, when routine screening begins. Direct examination of the colon should be repeated every 5 to 10 years through age 13, unless early forms of pre-cancerous polyps or small growths are found.  Hepatitis C blood testing is recommended for all people born from [birth year] through 1963 and any  individual with known risks for hepatitis C.  Practice safe sex. Use condoms and avoid high-risk sexual practices to reduce the spread of sexually transmitted infections (STIs). Sexually active women aged 36 and younger should be checked for Chlamydia, which is a common sexually transmitted infection. Older women with new or multiple partners should also be tested for Chlamydia. Testing for other STIs is recommended if you are sexually active and at increased risk.  Osteoporosis is a disease in which the bones lose minerals and strength with aging. This can result in serious bone fractures. The risk of osteoporosis can be identified using a bone density scan. Women ages 20 and over and women at risk for fractures or osteoporosis should discuss screening with their caregivers. Ask your caregiver whether you should be taking a calcium supplement or vitamin D to reduce the rate of osteoporosis.  Menopause can be associated with physical symptoms and risks. Hormone replacement therapy is available to decrease symptoms and risks. You should talk to your caregiver about whether hormone replacement therapy is right for you.  Use sunscreen. Apply sunscreen liberally and repeatedly throughout the day. You should seek shade when your shadow is shorter than you. Protect yourself by wearing long sleeves, pants, a wide-brimmed hat, and sunglasses year round, whenever you are outdoors.  Notify your caregiver of new moles or changes in moles, especially if there is a change in shape or color. Also notify your caregiver if a mole is larger than the size of a pencil eraser.  Stay current with your immunizations. Document Released: 10/23/2010 Document Revised: 08/04/2012 Document Reviewed: 10/23/2010 [facility] Patient Information 2014 [employer].

## 2014-10-20 ENCOUNTER — Encounter: Payer: Self-pay | Admitting: Diagnostic Neuroimaging

## 2014-10-20 ENCOUNTER — Ambulatory Visit (INDEPENDENT_AMBULATORY_CARE_PROVIDER_SITE_OTHER): Payer: Medicare Other | Admitting: Diagnostic Neuroimaging

## 2014-10-20 VITALS — BP 110/68 | HR 76 | Ht 62.0 in | Wt 221.8 lb

## 2014-10-20 DIAGNOSIS — G252 Other specified forms of tremor: Secondary | ICD-10-CM

## 2014-10-20 NOTE — Patient Instructions (Signed)
-   consider MRI cervical spine, EMG/NCS (electrical nerve test) - consider primidone for empiric treatment of tremor if it gets worse

## 2014-10-20 NOTE — Progress Notes (Signed)
[facility]  PATIENT: Judy Wells DOB: 03-28-1935  REFERRING CLINICIAN: Lodema Pilot [name] HISTORY FROM: patient  REASON FOR VISIT: new consult    HISTORICAL  CHIEF COMPLAINT:  Chief Complaint  Patient presents with  . Tremors    intention, rm 7, "right hand"    HISTORY OF PRESENT ILLNESS:    80 year old right-handed female here for evaluation of tremor. For past 2 months patient is onset of left greater than right handed tremor, pain left hand, difficulty holding objects. No significant neck pain or problems with her legs. She has history of hypothyroidism and diabetes. No family history of tremor. Patient having some difficulty with eating and holding cups.   REVIEW OF SYSTEMS: Full 14 system review of systems performed and notable only for shortness of breath tremor joint pain fatigue.  ALLERGIES: Allergies  Allergen Reactions  . Atorvastatin Other (See Comments)    Pain and falls  . Pravachol [Pravastatin Sodium] Itching    HOME MEDICATIONS: Outpatient Prescriptions Prior to Visit  Medication Sig Dispense Refill  . aspirin 81 MG tablet Take 81 mg by mouth.    . Calcium Carbonate-Vitamin D 600-400 MG-UNIT per tablet Take 1 tablet by mouth.    . cholecalciferol (VITAMIN D) 1000 UNITS tablet Take 1,000 Units by mouth daily.    Marland Kitchen. FLAXSEED, LINSEED, PO Take by mouth.    Marland Kitchen. glipiZIDE (GLUCOTROL) 10 MG tablet Take 10 mg by mouth daily.    . IRON PO Take by mouth. Slow release    . levothyroxine (SYNTHROID, LEVOTHROID) 100 MCG tablet Take 100 mcg by mouth daily before breakfast.    . metFORMIN (GLUCOPHAGE) 1000 MG tablet Take 1,000 mg by mouth 2 (two) times daily with a meal.     . pravastatin (PRAVACHOL) 20 MG tablet Take 1 tablet (20 mg total) by mouth every evening. 90 tablet 3   No facility-administered medications prior to visit.    PAST MEDICAL HISTORY: Past Medical History  Diagnosis Date  . H. pylori infection 11/2011  . Hypothyroid   . GERD  (gastroesophageal reflux disease)   . Anemia   . Diabetes mellitus type II   . HTN (hypertension)   . Hiatal hernia   . Osteopenia   . Osteoarthritis   . Vitamin D deficiency   . Coronary artery disease 1980's  . Wolf-Parkinson-White syndrome 1999    s/p ablation in 1997 (Dr. Odessa FlemingS. Klein) - after cardiac arrest while in [place]  . Diverticulosis 2011  . Myocardial infarction     secondary to WPW    PAST SURGICAL HISTORY: Past Surgical History  Procedure Laterality Date  . Appendectomy    . Abdominal adhesion surgery    . Colonoscopy    . Breast surgery      breast lump removed - tiny  . Nm myocar perf wall motion  01/2012    lexiscan myoview - normal pattern of perfusion in all regions, EF 64%  . Ablation  05/19/1997    Dr. Graciela HusbandsKlein - bidirectional conduction of right posterolateral AT - RF ablation  . Cardiac catheterization  02/19/1997    noraml L main, LAD free of disease, diagonal with no obstructions, L Cfx free of disease (along with OM & PDA being normal), RCA small & non-dominant & free of disease (Dr. Langston ReusingA. Little)    FAMILY HISTORY: Family History  Problem Relation Age of Onset  . Dementia Mother   . Heart disease Cousin   . Stroke Father   . Stroke  Maternal Grandmother   . Aneurysm Sister     SOCIAL HISTORY:  History   Social History  . Marital Status: Married    Spouse Name: Richard  . Number of Children: 4  . Years of Education: 13   Occupational History  .  [employer]   Social History Main Topics  . Smoking status: Never Smoker   . Smokeless tobacco: Never Used  . Alcohol Use: 0.0 oz/week    0 Standard drinks or equivalent per week     Comment: Rare  . Drug Use: No  . Sexual Activity: No     Comment: 1st intercourse 80 yo-Fewer than 5 partners   Other Topics Concern  . Not on file   Social History Narrative   Married, lives at home with husband   Caffeine use- coffee 3 cups daily     PHYSICAL EXAM  GENERAL EXAM/CONSTITUTIONAL: Vitals:    Filed Vitals:   10/20/14 1059  BP: 110/68  Pulse: 76  Height:  (1.575 m)  Weight: 221 lb 12.8 oz (100.608 kg)     Body mass index is 40.56 kg/(m^2).  Visual Acuity Screening   Right eye Left eye Both eyes  Without correction:  20/30   With correction:     Comments: 10/20/14 unable to see with right eye, has glasses but did not bring    Patient is in no distress; well developed, nourished and groomed; neck is supple  CARDIOVASCULAR:  Examination of carotid arteries is normal; no carotid bruits  Regular rate and rhythm, no murmurs  Examination of peripheral vascular system by observation and palpation is normal  EYES:  Ophthalmoscopic exam of optic discs and posterior segments is normal; no papilledema or hemorrhages  MUSCULOSKELETAL:  Gait, strength, tone, movements noted in Neurologic exam below  NEUROLOGIC: MENTAL STATUS:  No flowsheet data found.  awake, alert, oriented to person, place and time  recent and remote memory intact  normal attention and concentration  language fluent, comprehension intact, naming intact,   fund of knowledge appropriate  CRANIAL NERVE:   2nd - no papilledema on fundoscopic exam  2nd, 3rd, 4th, 6th - SMALL PUPILS, equal and reactive to light, visual fields full to confrontation, extraocular muscles intact, no nystagmus  5th - facial sensation symmetric  7th - facial strength symmetric  8th - hearing intact  9th - palate elevates symmetrically, uvula midline  11th - shoulder shrug symmetric  12th - tongue protrusion midline  SLURRED SPEECH  MOTOR:   normal bulk; EXCEPT BILATERAL THENAR ATROPHY; POSTURAL AND ACTION TREMOR IN BUE; BRADYKINESIA IN LUE AND LLE; NO COGWHEELING OR REST TREMOR. DIFFUSE 4/5 strength in the BUE, BLE  SENSORY:   normal and symmetric to light touch, temperature; INCONSISTENT VIBRATION SENSATION  COORDINATION:   finger-nose-finger, fine finger movements SLOW  REFLEXES:   deep  tendon reflexes TRACE and symmetric  GAIT/STATION:   narrow based gait; WADDLING ANTALGIC GAIT; UNSTEADY    DIAGNOSTIC DATA (LABS, IMAGING, TESTING) - I reviewed patient records, labs, notes, testing and imaging myself where available.  Lab Results  Component Value Date   WBC 4.1 11/16/2013   HGB 11.1* 11/16/2013   HCT 34.7* 11/16/2013   MCV 93.8 11/16/2013   PLT 263 11/16/2013      Component Value Date/Time   NA 137 07/28/2013 0926   K 4.8 07/28/2013 0926   CL 102 07/28/2013 0926   CO2 29 07/28/2013 0926   GLUCOSE 169* 07/28/2013 0926   BUN 11 07/28/2013 0926  CREATININE 0.63 07/28/2013 0926   CREATININE 0.6 04/22/2009 1124   CALCIUM 9.0 07/28/2013 0926   PROT 7.3 07/28/2013 0926   ALBUMIN 4.0 07/28/2013 0926   AST 11 07/28/2013 0926   ALT 10 07/28/2013 0926   ALKPHOS 66 07/28/2013 0926   BILITOT 0.8 07/28/2013 0926   No results found for: CHOL, HDL, LDLCALC, LDLDIRECT, TRIG, CHOLHDL No results found for: [id number] Lab Results  Component Value Date   VITAMINB12 473 12/17/2011   No results found for: TSH   04/09/14 MRI brain  1. 1 cm midline suprasellar mass related to the anterior aspect of the sella.Etiology uncertain, but most likely a small meningioma.  2..No significant abnormality in the brain.     ASSESSMENT AND PLAN  80 y.o. year old female here with intermittent postural and action tremor.   Ddx: essential tremor, enhanced physiologic tremor, cervical radiculopathy, peripheral neuropathy  PLAN: - consider MRI cervical spine, EMG/NCS (patient wants to hold off for now) - consider primidone for empiric treatment of tremor if it gets worse (patient wants to hold off for now)  Return in about 6 months (around 04/21/2015).    Suanne Marker, MD 10/20/2014, 11:47 AM Certified in Neurology, Neurophysiology and Neuroimaging  [facility] [address] [place], [place] [address] [phone]

## 2015-04-08 ENCOUNTER — Ambulatory Visit: Payer: Medicare Other | Admitting: Diagnostic Neuroimaging

## 2015-05-02 ENCOUNTER — Ambulatory Visit: Payer: Medicare Other | Admitting: Diagnostic Neuroimaging

## 2015-05-18 ENCOUNTER — Encounter (INDEPENDENT_AMBULATORY_CARE_PROVIDER_SITE_OTHER): Payer: Self-pay

## 2015-05-18 ENCOUNTER — Encounter: Payer: Self-pay | Admitting: Diagnostic Neuroimaging

## 2015-05-18 ENCOUNTER — Ambulatory Visit (INDEPENDENT_AMBULATORY_CARE_PROVIDER_SITE_OTHER): Payer: Medicare Other | Admitting: Diagnostic Neuroimaging

## 2015-05-18 VITALS — BP 118/64 | HR 71 | Ht 62.0 in | Wt 220.0 lb

## 2015-05-18 DIAGNOSIS — G252 Other specified forms of tremor: Secondary | ICD-10-CM

## 2015-05-18 NOTE — Progress Notes (Signed)
[facility]  PATIENT: Judy Wells DOB: 04-15-1935  REFERRING CLINICIAN: Lodema Pilot HISTORY FROM: patient  REASON FOR VISIT: follow up   HISTORICAL  CHIEF COMPLAINT:  Chief Complaint  Patient presents with  . Postural tremor    rm 7, "a  little worse, but could be funeral I went to today; people are beginning to notice"  . Follow-up    6 month    HISTORY OF PRESENT ILLNESS:    UPDATE 05/18/15: Since last visit, doing about the same. Tremor stable. No new events.   PRIOR HPI (10/20/14): 81 year old right-handed female here for evaluation of tremor. For past 2 months patient is onset of left greater than right handed tremor, pain left hand, difficulty holding objects. No significant neck pain or problems with her legs. She has history of hypothyroidism and diabetes. No family history of tremor. Patient having some difficulty with eating and holding cups.   REVIEW OF SYSTEMS: Full 14 system review of systems performed and notable only for shortness of breath tremor joint pain fatigue.  ALLERGIES: Allergies  Allergen Reactions  . Atorvastatin Other (See Comments)    Pain and falls  . Pravachol [Pravastatin Sodium] Itching    HOME MEDICATIONS: Outpatient Prescriptions Prior to Visit  Medication Sig Dispense Refill  . aspirin 81 MG tablet Take 81 mg by mouth.    . Calcium Carbonate-Vitamin D 600-400 MG-UNIT per tablet Take 1 tablet by mouth.    Marland Kitchen FLAXSEED, LINSEED, PO Take by mouth.    Marland Kitchen glipiZIDE (GLUCOTROL) 10 MG tablet Take 10 mg by mouth daily.    . IRON PO Take by mouth. Slow release    . levothyroxine (SYNTHROID, LEVOTHROID) 100 MCG tablet Take 100 mcg by mouth daily before breakfast.    . lisinopril (PRINIVIL,ZESTRIL) 2.5 MG tablet Take 2.5 mg by mouth.    . metFORMIN (GLUCOPHAGE) 1000 MG tablet Take 1,000 mg by mouth 2 (two) times daily with a meal.     . cholecalciferol (VITAMIN D) 1000 UNITS tablet Take 1,000 Units by mouth daily.     No  facility-administered medications prior to visit.    PAST MEDICAL HISTORY: Past Medical History  Diagnosis Date  . H. pylori infection 11/2011  . Hypothyroid   . GERD (gastroesophageal reflux disease)   . Anemia   . Diabetes mellitus type II   . HTN (hypertension)   . Hiatal hernia   . Osteopenia   . Osteoarthritis   . Vitamin D deficiency   . Coronary artery disease 1980's  . Wolf-Parkinson-White syndrome 1999    s/p ablation in 1997 (Dr. Odessa Fleming) - after cardiac arrest while in [place]  . Diverticulosis 2011  . Myocardial infarction (HCC)     secondary to WPW    PAST SURGICAL HISTORY: Past Surgical History  Procedure Laterality Date  . Appendectomy    . Abdominal adhesion surgery    . Colonoscopy    . Breast surgery      breast lump removed - tiny  . Nm myocar perf wall motion  01/2012    lexiscan myoview - normal pattern of perfusion in all regions, EF 64%  . Ablation  05/19/1997    Dr. Graciela Husbands - bidirectional conduction of right posterolateral AT - RF ablation  . Cardiac catheterization  02/19/1997    noraml L main, LAD free of disease, diagonal with no obstructions, L Cfx free of disease (along with OM & PDA being normal), RCA small & non-dominant & free of  disease (Dr. Langston Reusing)    FAMILY HISTORY: Family History  Problem Relation Age of Onset  . Dementia Mother   . Heart disease Cousin   . Stroke Father   . Stroke Maternal Grandmother   . Aneurysm Sister     SOCIAL HISTORY:  Social History   Social History  . Marital Status: Married    Spouse Name: Richard  . Number of Children: 4  . Years of Education: 13   Occupational History  .  [employer]   Social History Main Topics  . Smoking status: Never Smoker   . Smokeless tobacco: Never Used  . Alcohol Use: 0.0 oz/week    0 Standard drinks or equivalent per week     Comment: Rare  . Drug Use: No  . Sexual Activity: No     Comment: 1st intercourse 81 yo-Fewer than 5 partners   Other Topics Concern    . Not on file   Social History Narrative   Married, lives at home with husband   Caffeine use- coffee 3 cups daily     PHYSICAL EXAM  GENERAL EXAM/CONSTITUTIONAL: Vitals:  Filed Vitals:   05/18/15 1522  BP: 118/64  Pulse: 71  Height:  (1.575 m)  Weight: 220 lb (99.791 kg)   Body mass index is 40.23 kg/(m^2). No exam data present  Patient is in no distress; well developed, nourished and groomed; neck is supple  CARDIOVASCULAR:  Examination of carotid arteries is normal; no carotid bruits  Regular rate and rhythm, no murmurs  Examination of peripheral vascular system by observation and palpation is normal  EYES:  Ophthalmoscopic exam of optic discs and posterior segments is normal; no papilledema or hemorrhages  MUSCULOSKELETAL:  Gait, strength, tone, movements noted in Neurologic exam below  NEUROLOGIC: MENTAL STATUS:  No flowsheet data found.  awake, alert, oriented to person, place and time  recent and remote memory intact  normal attention and concentration  language fluent, comprehension intact, naming intact,   fund of knowledge appropriate  CRANIAL NERVE:   2nd - no papilledema on fundoscopic exam  2nd, 3rd, 4th, 6th - SMALL PUPILS, equal and reactive to light, visual fields full to confrontation, extraocular muscles intact, no nystagmus  5th - facial sensation symmetric  7th - facial strength symmetric  8th - hearing intact  9th - palate elevates symmetrically, uvula midline  11th - shoulder shrug symmetric  12th - tongue protrusion midline  SLURRED SPEECH  MOTOR:   normal bulk; EXCEPT BILATERAL THENAR ATROPHY; ACTION TREMOR IN RUE; NO POSTURAL TREMOR; NO BRADYKINESIA, COGWHEELING OR REST TREMOR. DIFFUSE 4/5 strength in the BUE, BLE  SENSORY:   normal and symmetric to light touch  COORDINATION:   finger-nose-finger, fine finger movements SLOW  REFLEXES:   deep tendon reflexes TRACE and symmetric  GAIT/STATION:    narrow based gait; WADDLING ANTALGIC GAIT; UNSTEADY    DIAGNOSTIC DATA (LABS, IMAGING, TESTING) - I reviewed patient records, labs, notes, testing and imaging myself where available.  Lab Results  Component Value Date   WBC 4.1 11/16/2013   HGB 11.1* 11/16/2013   HCT 34.7* 11/16/2013   MCV 93.8 11/16/2013   PLT 263 11/16/2013      Component Value Date/Time   NA 137 07/28/2013 0926   K 4.8 07/28/2013 0926   CL 102 07/28/2013 0926   CO2 29 07/28/2013 0926   GLUCOSE 169* 07/28/2013 0926   BUN 11 07/28/2013 0926   CREATININE 0.63 07/28/2013 0926   CREATININE 0.6 04/22/2009  1124   CALCIUM 9.0 07/28/2013 0926   PROT 7.3 07/28/2013 0926   ALBUMIN 4.0 07/28/2013 0926   AST 11 07/28/2013 0926   ALT 10 07/28/2013 0926   ALKPHOS 66 07/28/2013 0926   BILITOT 0.8 07/28/2013 0926   No results found for: CHOL, HDL, LDLCALC, LDLDIRECT, TRIG, CHOLHDL No results found for: ZOXW9U Lab Results  Component Value Date   VITAMINB12 473 12/17/2011   No results found for: TSH   04/09/14 MRI brain  1. 1 cm midline suprasellar mass related to the anterior aspect of the sella.Etiology uncertain, but most likely a small meningioma.  2..No significant abnormality in the brain.     ASSESSMENT AND PLAN  81 y.o. year old female here with intermittent postural and action tremor. Also with incidental suprasellar meningioma, not related to her tremor.   Ddx: essential tremor vs enhanced physiologic tremor  Action tremor    PLAN: - consider primidone for empiric treatment of tremor if it gets worse (patient wants to hold off for now) - use cane for fall prevention  Return in about 1 year (around 05/17/2016).    Suanne Marker, MD 05/18/2015, 3:47 PM Certified in Neurology, Neurophysiology and Neuroimaging  [facility] [address], [address] [place], [place] [address] [phone]

## 2015-05-18 NOTE — Patient Instructions (Signed)
Thank you for coming to see [place] at [facility]. I hope we have been able to provide you high quality care today.  You may receive a patient satisfaction survey over the next few weeks. We would appreciate your feedback and comments so that we may continue to improve ourselves and the health of our patients.  - monitor symptoms - use cane for fall prevention   ~~~~~~~~~~~~~~~~~~~~~~~~~~~~~~~~~~~~~~~~~~~~~~~~~~~~~~~~~~~~~~~~~  DR. Susane Bey'S GUIDE TO HAPPY AND HEALTHY LIVING These are some of my general health and wellness recommendations. Some of them may apply to you better than others. Please use common sense as you try these suggestions and feel free to ask me any questions.   ACTIVITY/FITNESS Mental, social, emotional and physical stimulation are very important for brain and body health. Try learning a new activity (arts, music, language, sports, games).  Keep moving your body to the best of your abilities. You can do this at home, inside or outside, the park, community center, gym or anywhere you like. Consider a physical therapist or personal trainer to get started. Consider the app Sworkit. Fitness trackers such as smart-watches, smart-phones or Fitbits can help as well.   NUTRITION Eat more plants: colorful vegetables, nuts, seeds and berries.  Eat less sugar, salt, preservatives and processed foods.  Avoid toxins such as cigarettes and alcohol.  Drink water when you are thirsty. Warm water with a slice of lemon is an excellent morning drink to start the day.  Consider these websites for more information The Nutrition Source (https://www.henry-hernandez.biz/) Precision Nutrition (WindowBlog.ch)   RELAXATION Consider practicing mindfulness meditation or other relaxation techniques such as deep breathing, prayer, yoga, tai chi, massage. See website mindful.org or the apps Headspace or Calm to help get  started.   SLEEP Try to get at least 7-8+ hours sleep per day. Regular exercise and reduced caffeine will help you sleep better. Practice good sleep hygeine techniques. See website sleep.org for more information.   PLANNING Prepare estate planning, living will, healthcare POA documents. Sometimes this is best planned with the help of an attorney. Theconversationproject.org and agingwithdignity.org are excellent resources.

## 2015-08-22 ENCOUNTER — Ambulatory Visit (INDEPENDENT_AMBULATORY_CARE_PROVIDER_SITE_OTHER): Payer: Medicare Other | Admitting: Internal Medicine

## 2015-08-22 ENCOUNTER — Encounter: Payer: Self-pay | Admitting: Internal Medicine

## 2015-08-22 VITALS — BP 118/62 | HR 68 | Ht 61.0 in | Wt 220.0 lb

## 2015-08-22 DIAGNOSIS — R251 Tremor, unspecified: Secondary | ICD-10-CM | POA: Diagnosis not present

## 2015-08-22 DIAGNOSIS — R0609 Other forms of dyspnea: Secondary | ICD-10-CM | POA: Diagnosis not present

## 2015-08-22 DIAGNOSIS — I456 Pre-excitation syndrome: Secondary | ICD-10-CM

## 2015-08-22 DIAGNOSIS — R0600 Dyspnea, unspecified: Secondary | ICD-10-CM

## 2015-08-22 MED ORDER — PROPRANOLOL HCL ER 60 MG PO CP24: 60.0000 mg | ORAL_CAPSULE | Freq: Every day | ORAL | Status: DC

## 2015-08-22 NOTE — Patient Instructions (Addendum)
Your physician recommends that you schedule a follow-up appointment in: ONE MONTH with Dr. Rennis GoldenHilty  MEDICATION CHANGES -- STOP lisinopril  -- START propranolol 60mg  once daily

## 2015-08-22 NOTE — Progress Notes (Signed)
OFFICE NOTE  Chief Complaint:  Shortness of breath, tremor  Primary Care Physician: Willow OraANDY,[name], MD  HPI:  Judy Wells is a 81 year old female who was briefly seen by Dr. Clarene DukeLittle years ago, was referred back to our office; however, Dr. Clarene DukeLittle is now retired. 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 Graciela HusbandsKlein after she suffered a cardiac arrest in [place] is [place] is from. Apparently had an MI at that time; however, we have no idea what her cardiac history is exactly, although she is listed as having a history of coronary disease. She also has recent history of GERD and gastritis and has been on treatment for H pylori and has diabetes and hypertension as well as a family history of heart disease. Recently, she has been having chest pressure which is possibly related to reflux. She admits that sometimes her symptoms are improved with jasmine tea.  At her last office visit she underwent stress testing in 2013 which was negative for ischemia and a preserved EF. Interestingly, she still has signs of preexcitation on her EKG. Today she has complaints of worsening shortness of breath. She has had about 15 pound weight gain since her last office visit in 2013. She reports that she occasionally has a cough and hasn't clear to whitish mucus. She says that her reflux symptoms are much better controlled on omeprazole however she is a little concerned this may be causing her shortness of breath.  She denies any orthopnea, PND or lower extremity edema.  I had the pleasure seeing Ms Judy Wells in the office today. Her chief complaint is some worsening shortness of breath with exertion. Seems to be doing activities such as making her bed. She is also recently had some tremor in her left hand however this is known by her primary care provider and has been addressed. She denies any chest pain.  08/22/2015  Judy Wells returns today for follow-up. She continues to have some mild  shortness of breath with exertion. She denies any recurrent palpitations. She's seen Dr. Levada DyPennumalli with neurology for tremor. He had considered medication for this. She reports that recently her handwriting has worsened somewhat. She also gets some shortness of breath, mostly with marked exertion with a sharp increase in heart rate. Some of this may be related to obesity. She is not physically active. She denies palpitations.  PMHx:  Past Medical History  Diagnosis Date  . H. pylori infection 11/2011  . Hypothyroid   . GERD (gastroesophageal reflux disease)   . Anemia   . Diabetes mellitus type II   . HTN (hypertension)   . Hiatal hernia   . Osteopenia   . Osteoarthritis   . Vitamin D deficiency   . Coronary artery disease 1980's  . Wolf-Parkinson-White syndrome 1999    s/p ablation in 1997 (Dr. Odessa FlemingS. Klein) - after cardiac arrest while in [place]  . Diverticulosis 2011  . Myocardial infarction (HCC)     secondary to WPW    Past Surgical History  Procedure Laterality Date  . Appendectomy    . Abdominal adhesion surgery    . Colonoscopy    . Breast surgery      breast lump removed - tiny  . Nm myocar perf wall motion  01/2012    lexiscan myoview - normal pattern of perfusion in all regions, EF 64%  . Ablation  05/19/1997    Dr. Graciela HusbandsKlein - bidirectional conduction of right posterolateral AT -  RF ablation  . Cardiac catheterization  02/19/1997    noraml L main, LAD free of disease, diagonal with no obstructions, L Cfx free of disease (along with OM & PDA being normal), RCA small & non-dominant & free of disease (Dr. Langston Reusing)    FAMHx:  Family History  Problem Relation Age of Onset  . Dementia Mother   . Heart disease Cousin   . Stroke Father   . Stroke Maternal Grandmother   . Aneurysm Sister     SOCHx:   reports that she has never smoked. She has never used smokeless tobacco. She reports that she drinks alcohol. She reports that she does not use illicit drugs.  ALLERGIES:   Allergies  Allergen Reactions  . Atorvastatin Other (See Comments)    Pain and falls  . Pravachol [Pravastatin Sodium] Itching    ROS: A comprehensive review of systems was negative except for: Constitutional: positive for weight gain Respiratory: positive for cough and dyspnea on exertion Neurological: positive for tremors  HOME MEDS: Current Outpatient Prescriptions  Medication Sig Dispense Refill  . aspirin 81 MG tablet Take 81 mg by mouth.    . Calcium Carbonate-Vitamin D 600-400 MG-UNIT per tablet Take 1 tablet by mouth.    Marland Kitchen FLAXSEED, LINSEED, PO Take by mouth.    Marland Kitchen glipiZIDE (GLUCOTROL) 10 MG tablet Take 10 mg by mouth daily.    . IRON PO Take by mouth. Slow release    . levothyroxine (SYNTHROID, LEVOTHROID) 100 MCG tablet Take 100 mcg by mouth daily before breakfast.    . metFORMIN (GLUCOPHAGE) 1000 MG tablet Take 1,000 mg by mouth 2 (two) times daily with a meal.     . propranolol ER (INDERAL LA) 60 MG 24 hr capsule Take 1 capsule (60 mg total) by mouth daily. 30 capsule 1   No current facility-administered medications for this visit.    LABS/IMAGING: No results found for this or any previous visit (from the past 48 hour(s)). No results found.  VITALS: BP 118/62 mmHg  Pulse 68  Ht 5\' 1"  (1.549 m)  Wt 220 lb (99.791 kg)  BMI 41.59 kg/m2  EXAM: General appearance: alert and no distress Neck: no carotid bruit, no JVD and thyroid not enlarged, symmetric, no tenderness/mass/nodules Lungs: diminished breath sounds bilaterally Heart: regular rate and rhythm, S1, S2 normal, no murmur, click, rub or gallop Abdomen: morbidly obese, protuberant Extremities: extremities normal, atraumatic, no cyanosis or edema Pulses: 2+ and symmetric Skin: Skin color, texture, turgor normal. No rashes or lesions Neurologic: Grossly normal Psych: Pleasant  EKG: Normal sinus rhythm at 68, delta waves diagnostic of WPW  ASSESSMENT: 1. Shortness of breath with exertion 2. History of  WPW status post ablation 3. DM2 4. GERD 5. Tremor  PLAN: 1.   Judy Wells report some persistent shortness of breath with exertion. Again this may be due to weight, deconditioning and other factors. She might benefit from low-dose beta blockade. In addition this could help her tremor. We'll start low-dose propranolol to see if this is going to benefit her some with her symptoms. She is on low-dose lisinopril for renal protection however blood pressure is low normal. I've advised her to stop that for the time being as we start low-dose beta blocker and will plan to see her back in a month to see if blood pressure is stable and this is helped either her shortness of breath and/or tremors.   Chrystie Nose, MD, [facility] Attending Cardiologist [facility]  Iantha Fallen  C Bevan Disney 08/22/2015, 1:37 PM

## 2015-09-09 ENCOUNTER — Encounter: Payer: Self-pay | Admitting: Gynecology

## 2015-09-09 ENCOUNTER — Ambulatory Visit (INDEPENDENT_AMBULATORY_CARE_PROVIDER_SITE_OTHER): Payer: Medicare Other | Admitting: Gynecology

## 2015-09-09 VITALS — BP 134/84 | Ht 62.0 in | Wt 220.0 lb

## 2015-09-09 DIAGNOSIS — K429 Umbilical hernia without obstruction or gangrene: Secondary | ICD-10-CM

## 2015-09-09 DIAGNOSIS — Z01419 Encounter for gynecological examination (general) (routine) without abnormal findings: Secondary | ICD-10-CM

## 2015-09-09 DIAGNOSIS — N952 Postmenopausal atrophic vaginitis: Secondary | ICD-10-CM

## 2015-09-09 NOTE — Progress Notes (Signed)
    Judy Wells Feb 20, 1935 [id number]        81 y.o.  [id number]  for breast and pelvic exam  Past medical history,surgical history, problem list, medications, allergies, family history and social history were all reviewed and documented as reviewed in the EPIC chart.  ROS:  Performed with pertinent positives and negatives included in the history, assessment and plan.   Additional significant findings :  none   Exam: Kennon PortelaKim Gardner assistant Filed Vitals:   09/09/15 1019  BP: 134/84  Height: 5\' 2"  (1.575 m)  Weight: 220 lb (99.791 kg)   General appearance:  Normal affect, orientation and appearance. Skin: Grossly normal HEENT: Without gross lesions.  No cervical or supraclavicular adenopathy. Thyroid normal.  Lungs:  Clear without wheezing, rales or rhonchi Cardiac: RR, without RMG Abdominal:  Soft, nontender, without masses, guarding, rebound, organomegaly or hernia Breasts:  Examined lying and sitting without masses, retractions, discharge or axillary adenopathy. Pelvic:  Ext/BUS/vagina with atrophic changes  Cervix with atrophic changes  Uterus unable to clearly palpate but no gross masses or tenderness  Adnexa without gross masses or tenderness    Anus and perineum normal   Rectovaginal normal sphincter tone without palpated masses or tenderness.    Assessment/Plan:  81 y.o. [id number] female for breast and pelvic exam.  1. Postmenopausal/atrophic genital changes. Without significant hot flashes, night sweats, vaginal dryness or any vaginal bleeding. Continue to monitor report any issues or vaginal bleeding. 2. Osteoporosis. Being followed by her primary physician with recent DEXA. Do not have copies of these. She is being treated with Prolia. She'll continue follow up with him in reference to this. 3. Umbilical hernia. Easily reducible. Present for years not bothersome to the patient. Continue to monitor report any issues. 4. Mammography coming due in July and I reminded her  to schedule this. SBE monthly reviewed. 5. Pap smear 2011. No Pap smear done today. No history of significant abnormal Pap smears. Per current screening guidelines we both agree to stop screening based on age. 6. Colonoscopy 2011. Repeat at their recommended interval. 7. Health maintenance. No routine lab work done as this is done at her primary physician's office. Follow up 1 year, sooner as needed.   Dara LordsFONTAINE,Philomene Haff P MD, 10:46 AM 09/09/2015

## 2015-09-09 NOTE — Patient Instructions (Signed)

## 2015-09-22 ENCOUNTER — Ambulatory Visit: Payer: Medicare Other | Admitting: Internal Medicine

## 2015-09-22 ENCOUNTER — Encounter: Payer: Self-pay | Admitting: *Deleted

## 2015-10-17 ENCOUNTER — Encounter: Payer: Self-pay | Admitting: Internal Medicine

## 2015-10-17 ENCOUNTER — Ambulatory Visit (INDEPENDENT_AMBULATORY_CARE_PROVIDER_SITE_OTHER): Payer: Medicare Other | Admitting: Internal Medicine

## 2015-10-17 VITALS — BP 124/72 | HR 57 | Ht 62.0 in | Wt 221.0 lb

## 2015-10-17 DIAGNOSIS — I456 Pre-excitation syndrome: Secondary | ICD-10-CM

## 2015-10-17 DIAGNOSIS — R251 Tremor, unspecified: Secondary | ICD-10-CM | POA: Diagnosis not present

## 2015-10-17 DIAGNOSIS — R0609 Other forms of dyspnea: Secondary | ICD-10-CM

## 2015-10-17 DIAGNOSIS — R0600 Dyspnea, unspecified: Secondary | ICD-10-CM

## 2015-10-17 NOTE — Progress Notes (Signed)
OFFICE NOTE  Chief Complaint:  Fpllow-up  Primary Care Physician: Willow OraANDY,[name], MD  HPI:  Judy Wells is a 81 year old female who was briefly seen by Dr. Clarene DukeLittle years ago, was referred back to our office; however, Dr. Clarene DukeLittle is now retired. 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 Graciela HusbandsKlein after she suffered a cardiac arrest in [place] is where she is from. Apparently had an MI at that time; however, we have no idea what her cardiac history is exactly, although she is listed as having a history of coronary disease. She also has recent history of GERD and gastritis and has been on treatment for H pylori and has diabetes and hypertension as well as a family history of heart disease. Recently, she has been having chest pressure which is possibly related to reflux. She admits that sometimes her symptoms are improved with jasmine tea.  At her last office visit she underwent stress testing in 2013 which was negative for ischemia and a preserved EF. Interestingly, she still has signs of preexcitation on her EKG. Today she has complaints of worsening shortness of breath. She has had about 15 pound weight gain since her last office visit in 2013. She reports that she occasionally has a cough and hasn't clear to whitish mucus. She says that her reflux symptoms are much better controlled on omeprazole however she is a little concerned this may be causing her shortness of breath.  She denies any orthopnea, PND or lower extremity edema.  I had the pleasure seeing [name] in the office today. Her chief complaint is some worsening shortness of breath with exertion. Seems to be doing activities such as making her bed. She is also recently had some tremor in her left hand however this is known by her primary care provider and has been addressed. She denies any chest pain.  08/22/2015  [name] returns today for follow-up. She continues to have some mild shortness of  breath with exertion. She denies any recurrent palpitations. She's seen Dr. Levada DyPennumalli with neurology for tremor. He had considered medication for this. She reports that recently her handwriting has worsened somewhat. She also gets some shortness of breath, mostly with marked exertion with a sharp increase in heart rate. Some of this may be related to obesity. She is not physically active. She denies palpitations.  10/17/2015  [name] returns today for follow-up. She reports a nice improvement in her tremors which have almost totally stopped on low-dose beta blocker. She also notes a small improvement in her shortness of breath with exertion, again I feel that this is related to the beta blocker effect. She does need to continue to work on some activity and weight loss. Heart rate is now better controlled in the upper 50s and blood pressures at goal 124/72.  PMHx:  Past Medical History  Diagnosis Date  . H. pylori infection 11/2011  . Hypothyroid   . GERD (gastroesophageal reflux disease)   . Anemia   . Diabetes mellitus type II   . HTN (hypertension)   . Hiatal hernia   . Osteopenia   . Osteoarthritis   . Vitamin D deficiency   . Coronary artery disease 1980's  . Wolf-Parkinson-White syndrome 1999    s/p ablation in 1997 (Dr. Odessa FlemingS. Klein) - after cardiac arrest while in [place]  . Diverticulosis 2011  . Myocardial infarction [facility](HCC)     secondary to WPW    Past Surgical  History  Procedure Laterality Date  . Appendectomy    . Abdominal adhesion surgery    . Colonoscopy    . Breast surgery      breast lump removed - tiny  . Nm myocar perf wall motion  01/2012    lexiscan myoview - normal pattern of perfusion in all regions, EF 64%  . Ablation  05/19/1997    Dr. Graciela HusbandsKlein - bidirectional conduction of right posterolateral AT - RF ablation  . Cardiac catheterization  02/19/1997    noraml L main, LAD free of disease, diagonal with no obstructions, L Cfx free of disease (along with OM & PDA  being normal), RCA small & non-dominant & free of disease (Dr. Langston ReusingA. Little)    FAMHx:  Family History  Problem Relation Age of Onset  . Dementia Mother   . Heart disease Cousin   . Stroke Father   . Stroke Maternal Grandmother   . Aneurysm Sister     SOCHx:   reports that she has never smoked. She has never used smokeless tobacco. She reports that she drinks alcohol. She reports that she does not use illicit drugs.  ALLERGIES:  Allergies  Allergen Reactions  . Atorvastatin Other (See Comments)    Pain and falls  . Pravachol [Pravastatin Sodium] Itching    ROS: Pertinent items noted in HPI and remainder of comprehensive ROS otherwise negative.  HOME MEDS: Current Outpatient Prescriptions  Medication Sig Dispense Refill  . aspirin 81 MG tablet Take 81 mg by mouth.    . Calcium Carbonate-Vitamin D 600-400 MG-UNIT per tablet Take 1 tablet by mouth.    Marland Kitchen. FLAXSEED, LINSEED, PO Take by mouth.    Marland Kitchen. glipiZIDE (GLUCOTROL) 10 MG tablet Take 10 mg by mouth daily.    . IRON PO Take by mouth. Slow release    . levothyroxine (SYNTHROID, LEVOTHROID) 100 MCG tablet Take 100 mcg by mouth daily before breakfast.    . metFORMIN (GLUCOPHAGE) 1000 MG tablet Take 1,000 mg by mouth 2 (two) times daily with a meal.     . propranolol ER (INDERAL LA) 60 MG 24 hr capsule Take 1 capsule (60 mg total) by mouth daily. 30 capsule 1   No current facility-administered medications for this visit.    LABS/IMAGING: No results found for this or any previous visit (from the past 48 hour(s)). No results found.  VITALS: BP 124/72 mmHg  Pulse 57  Ht 5\' 2"  (1.575 m)  Wt 221 lb (100.245 kg)  BMI 40.41 kg/m2  SpO2 94%  EXAM: General appearance: alert and no distress Lungs: diminished breath sounds bilaterally and rhonchi bilaterally Neurologic: Mental status: Alert, oriented, thought content appropriate, no gross tremor  EKG: Normal sinus rhythm at 57, delta waves diagnostic of  WPW  ASSESSMENT: 1. Shortness of breath with exertion - improved on b-blocker 2. History of WPW status post ablation 3. DM2 4. GERD 5. Tremor - improved on b-blocker  PLAN: 1.   Mrs. Reyes IvanKersey Has had improvement in her shortness of breath and tremor both on low-dose propranolol. I did advise that she continue on this medication. I've encouraged her to continue to work on some walking or light activities, which is limited by her knee problems and dietary changes to work on weight loss. Overall she is doing much better. Plan to see her back annually or sooner as necessary.  Chrystie NoseKenneth C. Hilty, MD, [facility] Attending Cardiologist [facility]  Chrystie NoseKenneth C Hilty 10/17/2015, 8:34 AM

## 2015-10-17 NOTE — Patient Instructions (Signed)
Medication Instructions:  Your physician recommends that you continue on your current medications as directed. Please refer to the Current Medication list given to you today.   Follow-Up: Your physician wants you to follow-up in: 12 MONTHS WITH DR HILTY. You will receive a reminder letter in the mail two months in advance. If you don't receive a letter, please call our office to schedule the follow-up appointment.   Any Other Special Instructions Will Be Listed Below (If Applicable).     If you need a refill on your cardiac medications before your next appointment, please call your pharmacy.   

## 2015-10-24 ENCOUNTER — Other Ambulatory Visit: Payer: Self-pay | Admitting: Internal Medicine

## 2015-10-24 MED ORDER — PROPRANOLOL HCL ER 60 MG PO CP24: 60.0000 mg | ORAL_CAPSULE | Freq: Every day | ORAL | Status: DC

## 2015-10-24 NOTE — Telephone Encounter (Signed)
Rx request sent to pharmacy.  

## 2016-05-21 ENCOUNTER — Ambulatory Visit (INDEPENDENT_AMBULATORY_CARE_PROVIDER_SITE_OTHER): Payer: Medicare Other | Admitting: Diagnostic Neuroimaging

## 2016-05-21 ENCOUNTER — Encounter: Payer: Self-pay | Admitting: Diagnostic Neuroimaging

## 2016-05-21 VITALS — BP 118/60 | HR 62 | Wt 216.0 lb

## 2016-05-21 DIAGNOSIS — G252 Other specified forms of tremor: Secondary | ICD-10-CM

## 2016-05-21 DIAGNOSIS — R269 Unspecified abnormalities of gait and mobility: Secondary | ICD-10-CM | POA: Diagnosis not present

## 2016-05-21 NOTE — Progress Notes (Signed)
[facility]  PATIENT: Judy Wells DOB: 01/24/1935  REFERRING CLINICIAN: Lodema Pilot [name] HISTORY FROM: patient  REASON FOR VISIT: follow up   HISTORICAL  CHIEF COMPLAINT:  Chief Complaint  Patient presents with  . Tremors    rm 7, "no change in tremors"  . Follow-up    1 year    HISTORY OF PRESENT ILLNESS:   UPDATE 05/21/16: Since last visit, tremor stable. Some intermittent balance issues. No falls. Still working 2 days per week.   UPDATE 05/18/15: Since last visit, doing about the same. Tremor stable. No new events.   PRIOR HPI (10/20/14): 82 year old right-handed female here for evaluation of tremor. For past 2 months patient is onset of left greater than right handed tremor, pain left hand, difficulty holding objects. No significant neck pain or problems with her legs. She has history of hypothyroidism and diabetes. No family history of tremor. Patient having some difficulty with eating and holding cups.   REVIEW OF SYSTEMS: Full 14 system review of systems performed and negative except for fatigue tremors back pain leg swelling.    ALLERGIES: Allergies  Allergen Reactions  . Atorvastatin Other (See Comments)    Pain and falls  . Pravachol [Pravastatin Sodium] Itching    HOME MEDICATIONS: Outpatient Medications Prior to Visit  Medication Sig Dispense Refill  . aspirin 81 MG tablet Take 81 mg by mouth.    . Calcium Carbonate-Vitamin D 600-400 MG-UNIT per tablet Take 1 tablet by mouth.    Marland Kitchen. FLAXSEED, LINSEED, PO Take by mouth.    Marland Kitchen. glipiZIDE (GLUCOTROL) 10 MG tablet Take 10 mg by mouth daily.    Marland Kitchen. levothyroxine (SYNTHROID, LEVOTHROID) 100 MCG tablet Take 100 mcg by mouth daily before breakfast.    . metFORMIN (GLUCOPHAGE) 1000 MG tablet Take 1,000 mg by mouth 2 (two) times daily with a meal.     . propranolol ER (INDERAL LA) 60 MG 24 hr capsule Take 1 capsule (60 mg total) by mouth daily. 30 capsule 12  . IRON PO Take by mouth. Slow release     No  facility-administered medications prior to visit.     PAST MEDICAL HISTORY: Past Medical History:  Diagnosis Date  . Anemia   . Coronary artery disease 1980's  . Diabetes mellitus type II   . Diverticulosis 2011  . GERD (gastroesophageal reflux disease)   . H. pylori infection 11/2011  . Hiatal hernia   . HTN (hypertension)   . Hypothyroid   . Myocardial infarction    secondary to WPW  . Osteoarthritis   . Osteopenia   . Vitamin D deficiency   . Wolf-Parkinson-White syndrome 1999   s/p ablation in 1997 (Dr. Odessa FlemingS. Klein) - after cardiac arrest while in [place]    PAST SURGICAL HISTORY: Past Surgical History:  Procedure Laterality Date  . ABDOMINAL ADHESION SURGERY    . ABLATION  05/19/1997   Dr. Graciela HusbandsKlein - bidirectional conduction of right posterolateral AT - RF ablation  . APPENDECTOMY    . BREAST SURGERY     breast lump removed - tiny  . CARDIAC CATHETERIZATION  02/19/1997   noraml L main, LAD free of disease, diagonal with no obstructions, L Cfx free of disease (along with OM & PDA being normal), RCA small & non-dominant & free of disease (Dr. Langston ReusingA. Little)  . CATARACT EXTRACTION    . COLONOSCOPY    . NM MYOCAR PERF WALL MOTION  01/2012   lexiscan myoview - normal pattern of perfusion in  all regions, EF 64%    FAMILY HISTORY: Family History  Problem Relation Age of Onset  . Dementia Mother   . Stroke Father   . Stroke Maternal Grandmother   . Aneurysm Sister   . Heart disease Cousin   . Stroke Sister     SOCIAL HISTORY:  Social History   Social History  . Marital status: Married    Spouse name: Richard  . Number of children: 4  . Years of education: 15   Occupational History  .  [employer]   Social History Main Topics  . Smoking status: Never Smoker  . Smokeless tobacco: Never Used  . Alcohol use 0.0 oz/week     Comment: Rare  . Drug use: No  . Sexual activity: No     Comment: 1st intercourse 82 yo-Fewer than 5 partners   Other Topics Concern  . Not on  file   Social History Narrative   Married, lives at home with husband   Caffeine use- coffee 3 cups daily     PHYSICAL EXAM  GENERAL EXAM/CONSTITUTIONAL: Vitals:  Vitals:   05/21/16 1401  BP: 118/60  Pulse: 62  Weight: 216 lb (98 kg)   Body mass index is 39.51 kg/m. No exam data present  Patient is in no distress; well developed, nourished and groomed; neck is supple  CARDIOVASCULAR:  Examination of carotid arteries is normal; no carotid bruits  Regular rate and rhythm, no murmurs  Examination of peripheral vascular system by observation and palpation is normal  EYES:  Ophthalmoscopic exam of optic discs and posterior segments is normal; no papilledema or hemorrhages  MUSCULOSKELETAL:  Gait, strength, tone, movements noted in Neurologic exam below  NEUROLOGIC: MENTAL STATUS:  No flowsheet data found.  awake, alert, oriented to person, place and time  recent and remote memory intact  normal attention and concentration  language fluent, comprehension intact, naming intact,   fund of knowledge appropriate  CRANIAL NERVE:   2nd - no papilledema on fundoscopic exam  2nd, 3rd, 4th, 6th - SMALL PUPILS, equal and reactive to light, visual fields full to confrontation, extraocular muscles intact, no nystagmus  5th - facial sensation symmetric  7th - facial strength symmetric  8th - hearing intact  9th - palate elevates symmetrically, uvula midline  11th - shoulder shrug symmetric  12th - tongue protrusion midline  SLURRED SPEECH  MOTOR:   normal bulk; EXCEPT BILATERAL THENAR ATROPHY; ACTION TREMOR IN RUE; NO POSTURAL TREMOR; NO BRADYKINESIA, COGWHEELING OR REST TREMOR. DIFFUSE 4/5 strength in the BUE, BLE  SENSORY:   normal and symmetric to light touch  COORDINATION:   finger-nose-finger, fine finger movements SLOW  REFLEXES:   deep tendon reflexes TRACE and symmetric  GAIT/STATION:   narrow based gait; WADDLING ANTALGIC GAIT;  UNSTEADY    DIAGNOSTIC DATA (LABS, IMAGING, TESTING) - I reviewed patient records, labs, notes, testing and imaging myself where available.  Lab Results  Component Value Date   WBC 4.1 11/16/2013   HGB 11.1 (L) 11/16/2013   HCT 34.7 (L) 11/16/2013   MCV 93.8 11/16/2013   PLT 263 11/16/2013      Component Value Date/Time   NA 137 07/28/2013 0926   K 4.8 07/28/2013 0926   CL 102 07/28/2013 0926   CO2 29 07/28/2013 0926   GLUCOSE 169 (H) 07/28/2013 0926   BUN 11 07/28/2013 0926   CREATININE 0.63 07/28/2013 0926   CALCIUM 9.0 07/28/2013 0926   PROT 7.3 07/28/2013 0926   ALBUMIN 4.0  07/28/2013 0926   AST 11 07/28/2013 0926   ALT 10 07/28/2013 0926   ALKPHOS 66 07/28/2013 0926   BILITOT 0.8 07/28/2013 0926   No results found for: CHOL, HDL, LDLCALC, LDLDIRECT, TRIG, CHOLHDL No results found for: IONG2X Lab Results  Component Value Date   VITAMINB12 473 12/17/2011   No results found for: TSH   04/09/14 MRI brain  1. 1 cm midline suprasellar mass related to the anterior aspect of the sella.Etiology uncertain, but most likely a small meningioma.  2..No significant abnormality in the brain.     ASSESSMENT AND PLAN  82 y.o. year old female here with intermittent postural and action tremor. Also with incidental suprasellar meningioma, not related to her tremor.   Ddx: essential tremor vs enhanced physiologic tremor  Action tremor  Postural tremor  Gait difficulty    PLAN: - could consider primidone for empiric treatment of tremor if it gets worse (sxs are mild and patient wants to hold off for now) - use cane or walker for fall prevention - consider PT evaluation  Orders Placed This Encounter  Procedures  . For home use only DME 4 wheeled rolling walker with seat   Return if symptoms worsen or fail to improve, for return to PCP.    Suanne Marker, MD 05/21/2016, 2:18 PM Certified in Neurology, Neurophysiology and Neuroimaging  [facility] [address] [place], [place] [address] [phone]

## 2016-09-12 ENCOUNTER — Encounter: Payer: Medicare Other | Admitting: Gynecology

## 2016-09-26 ENCOUNTER — Encounter: Payer: Medicare Other | Admitting: Gynecology

## 2016-10-10 ENCOUNTER — Encounter: Payer: Medicare Other | Admitting: Gynecology

## 2016-10-16 ENCOUNTER — Ambulatory Visit (INDEPENDENT_AMBULATORY_CARE_PROVIDER_SITE_OTHER): Payer: Medicare Other | Admitting: Internal Medicine

## 2016-10-16 VITALS — BP 120/70 | HR 65 | Ht 62.0 in | Wt 218.8 lb

## 2016-10-16 DIAGNOSIS — R251 Tremor, unspecified: Secondary | ICD-10-CM

## 2016-10-16 DIAGNOSIS — I456 Pre-excitation syndrome: Secondary | ICD-10-CM

## 2016-10-16 DIAGNOSIS — M79671 Pain in right foot: Secondary | ICD-10-CM | POA: Diagnosis not present

## 2016-10-16 DIAGNOSIS — R0600 Dyspnea, unspecified: Secondary | ICD-10-CM

## 2016-10-16 DIAGNOSIS — R0609 Other forms of dyspnea: Secondary | ICD-10-CM | POA: Diagnosis not present

## 2016-10-16 MED ORDER — PROPRANOLOL HCL ER 60 MG PO CP24: 60.0000 mg | ORAL_CAPSULE | Freq: Every day | ORAL | 11 refills | Status: DC

## 2016-10-16 NOTE — Patient Instructions (Addendum)
Your physician wants you to follow-up in: ONE YEAR with Dr. Rennis GoldenHilty. You will receive a reminder letter in the mail two months in advance. If you don't receive a letter, please call our office to schedule the follow-up appointment.  Dr. Rennis GoldenHilty has referred you to Dr. Darrelyn HillockGioffre for evaluation of your foot. You have an appointment today at 4:15pm. Please arrive at 3:45pm & bring insurance card + med list.

## 2016-10-16 NOTE — Progress Notes (Signed)
OFFICE NOTE  Chief Complaint:  Follow-up, right 5th toe pain/foot swelling  Primary Care Physician: Willow Ora, MD  HPI:  Judy Wells is a 82 year old female who was briefly seen by Dr. Clarene Duke years ago, was referred back to our office; however, Dr. Clarene Duke is now retired. 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 Graciela Husbands after she suffered a cardiac arrest in [place], [place] is where she is from. Apparently had an MI at that time; however, we have no idea what her cardiac history is exactly, although she is listed as having a history of coronary disease. She also has recent history of GERD and gastritis and has been on treatment for H pylori and has diabetes and hypertension as well as a family history of heart disease. Recently, she has been having chest pressure which is possibly related to reflux. She admits that sometimes her symptoms are improved with jasmine tea.  At her last office visit she underwent stress testing in 2013 which was negative for ischemia and a preserved EF. Interestingly, she still has signs of preexcitation on her EKG. Today she has complaints of worsening shortness of breath. She has had about 15 pound weight gain since her last office visit in 2013. She reports that she occasionally has a cough and hasn't clear to whitish mucus. She says that her reflux symptoms are much better controlled on omeprazole however she is a little concerned this may be causing her shortness of breath.  She denies any orthopnea, PND or lower extremity edema.  I had the pleasure seeing Ms Heiny in the office today. Her chief complaint is some worsening shortness of breath with exertion. Seems to be doing activities such as making her bed. She is also recently had some tremor in her left hand however this is known by her primary care provider and has been addressed. She denies any chest pain.  08/22/2015  Miss Fritsch returns today for follow-up. She continues  to have some mild shortness of breath with exertion. She denies any recurrent palpitations. She's seen Dr. Levada Dy with neurology for tremor. He had considered medication for this. She reports that recently her handwriting has worsened somewhat. She also gets some shortness of breath, mostly with marked exertion with a sharp increase in heart rate. Some of this may be related to obesity. She is not physically active. She denies palpitations.  10/17/2015  Ms. Hamor returns today for follow-up. She reports a nice improvement in her tremors which have almost totally stopped on low-dose beta blocker. She also notes a small improvement in her shortness of breath with exertion, again I feel that this is related to the beta blocker effect. She does need to continue to work on some activity and weight loss. Heart rate is now better controlled in the upper 50s and blood pressures at goal 124/72.  10/16/2016  Ms. Podoll was seen in follow-up today. She reports that she recently returned from an extended trip to [place]. Unfortunate she's been under a lot of stress with family issues here. When she was there she felt well and then when she returned she is reported some shortness of breath, difficulty sleeping and worsened anxiety. On Friday of last week she reportedly smashed her fifth toe on the right foot when getting out of bed at night. Since then she's had pain and tenderness over that toe and swelling of the right midfoot. From a cardiac standpoint she denies any worsening  palpitations or tachycardia. She denies any chest pain. Weight is up 2 pounds since we last saw her.  PMHx:  Past Medical History:  Diagnosis Date  . Anemia   . Coronary artery disease 1980's  . Diabetes mellitus type II   . Diverticulosis 2011  . GERD (gastroesophageal reflux disease)   . H. pylori infection 11/2011  . Hiatal hernia   . HTN (hypertension)   . Hypothyroid   . Myocardial infarction    secondary to WPW  .  Osteoarthritis   . Osteopenia   . Vitamin D deficiency   . Wolf-Parkinson-White syndrome 1999   s/p ablation in 1997 (Dr. Odessa Fleming) - after cardiac arrest while in [place]    Past Surgical History:  Procedure Laterality Date  . ABDOMINAL ADHESION SURGERY    . ABLATION  05/19/1997   Dr. Graciela Husbands - bidirectional conduction of right posterolateral AT - RF ablation  . APPENDECTOMY    . BREAST SURGERY     breast lump removed - tiny  . CARDIAC CATHETERIZATION  02/19/1997   noraml L main, LAD free of disease, diagonal with no obstructions, L Cfx free of disease (along with OM & PDA being normal), RCA small & non-dominant & free of disease (Dr. Langston Reusing)  . CATARACT EXTRACTION    . COLONOSCOPY    . NM MYOCAR PERF WALL MOTION  01/2012   lexiscan myoview - normal pattern of perfusion in all regions, EF 64%    FAMHx:  Family History  Problem Relation Age of Onset  . Dementia Mother   . Stroke Father   . Stroke Maternal Grandmother   . Aneurysm Sister   . Heart disease Cousin   . Stroke Sister     SOCHx:   reports that she has never smoked. She has never used smokeless tobacco. She reports that she drinks alcohol. She reports that she does not use drugs.  ALLERGIES:  Allergies  Allergen Reactions  . Atorvastatin Other (See Comments)    Pain and falls  . Pravachol [Pravastatin Sodium] Itching    ROS: Pertinent items noted in HPI and remainder of comprehensive ROS otherwise negative.  HOME MEDS: Current Outpatient Prescriptions  Medication Sig Dispense Refill  . aspirin 81 MG tablet Take 81 mg by mouth.    . Calcium Carbonate-Vitamin D 600-400 MG-UNIT per tablet Take 1 tablet by mouth.    Marland Kitchen FLAXSEED, LINSEED, PO Take by mouth.    . fluticasone (FLONASE) 50 MCG/ACT nasal spray Instill 1 spray into each nostril QAM    . glipiZIDE (GLUCOTROL) 10 MG tablet Take 10 mg by mouth daily.    . IRON PO Take by mouth. Slow release    . levothyroxine (SYNTHROID, LEVOTHROID) 100 MCG tablet  Take 100 mcg by mouth daily before breakfast.    . metFORMIN (GLUCOPHAGE) 1000 MG tablet Take 1,000 mg by mouth 2 (two) times daily with a meal.     . propranolol ER (INDERAL LA) 60 MG 24 hr capsule Take 1 capsule (60 mg total) by mouth daily. 30 capsule 12   No current facility-administered medications for this visit.     LABS/IMAGING: No results found for this or any previous visit (from the past 48 hour(s)). No results found.  VITALS: BP 120/70   Pulse 65   Ht 5\' 2"  (1.575 m)   Wt 218 lb 12.8 oz (99.2 kg)   BMI 40.02 kg/m   EXAM: General appearance: alert, no distress and morbidly obese Neck: no carotid  bruit and no JVD Lungs: clear to auscultation bilaterally Heart: regular rate and rhythm, S1, S2 normal, no murmur, click, rub or gallop Abdomen: soft, non-tender; bowel sounds normal; no masses,  no organomegaly Extremities: edema 1+ midfoot and Tenderness to palpation over the fifth digit on the right foot at the MTP joint Pulses: 2+ and symmetric Skin: No warmth or rubor over the right foot Neurologic: Grossly normal Psych: Pleasant  EKG: Ectopic atrial rhythm with short PR interval at 65, delta waves diagnostic of WPW - personally reviewed  ASSESSMENT: 1. Shortness of breath with exertion - improved on b-blocker, may be related to stress/weight 2. History of WPW status post ablation 3. DM2 4. GERD 5. Tremor - improved on b-blocker 6. Right 5th toe pain/swelling - fracture?  PLAN: 1.   Mrs. Reyes IvanKersey is stable with regards to her WPW. She's not had any worsening palpitations or tachycardia. Her shortness of breath was fairly normal when she was away from her family in [place] however when she returned she seems to be more symptomatic. Unfortunately she injured her right fifth toe with pain and swelling over the right midfoot. I suspect she may have a fracture. I've advised [facility]. We've contacted the office and Dr. Ciro BackerGiofree  will see her today.  Follow-up with me annually or sooner as necessary.  Chrystie NoseKenneth C. Hilty, MD, [facility] Attending Cardiologist [facility]  Chrystie NoseKenneth C Hilty 10/16/2016, 9:27 AM

## 2016-12-03 ENCOUNTER — Encounter (HOSPITAL_COMMUNITY): Payer: Self-pay | Admitting: Emergency Medicine

## 2016-12-03 ENCOUNTER — Ambulatory Visit (HOSPITAL_COMMUNITY)
Admission: EM | Admit: 2016-12-03 | Discharge: 2016-12-03 | Disposition: A | Discharge status: Home / Self Care | Payer: Medicare Other | Attending: Family Medicine | Admitting: Family Medicine

## 2016-12-03 DIAGNOSIS — T783XXA Angioneurotic edema, initial encounter: Secondary | ICD-10-CM | POA: Diagnosis not present

## 2016-12-03 DIAGNOSIS — T63441A Toxic effect of venom of bees, accidental (unintentional), initial encounter: Secondary | ICD-10-CM

## 2016-12-03 DIAGNOSIS — R220 Localized swelling, mass and lump, head: Secondary | ICD-10-CM | POA: Diagnosis not present

## 2016-12-03 DIAGNOSIS — R2231 Localized swelling, mass and lump, right upper limb: Secondary | ICD-10-CM | POA: Diagnosis not present

## 2016-12-03 MED ORDER — PREDNISONE 10 MG (21) PO TBPK: ORAL_TABLET | Freq: Every day | ORAL | 0 refills | Status: DC

## 2016-12-03 MED ORDER — DIPHENHYDRAMINE HCL 50 MG/ML IJ SOLN
25.0000 mg | Freq: Once | INTRAMUSCULAR | Status: AC
  Administered 2016-12-03: 25 mg via INTRAMUSCULAR

## 2016-12-03 MED ORDER — METHYLPREDNISOLONE SODIUM SUCC 125 MG IJ SOLR
125.0000 mg | Freq: Once | INTRAMUSCULAR | Status: AC
  Administered 2016-12-03: 125 mg via INTRAMUSCULAR

## 2016-12-03 MED ORDER — DIPHENHYDRAMINE HCL 50 MG/ML IJ SOLN
INTRAMUSCULAR | Status: AC
Start: 2016-12-03 — End: 2016-12-03
  Filled 2016-12-03: qty 1

## 2016-12-03 MED ORDER — METHYLPREDNISOLONE SODIUM SUCC 125 MG IJ SOLR
INTRAMUSCULAR | Status: AC
  Filled 2016-12-03: qty 2

## 2016-12-03 NOTE — ED Provider Notes (Signed)
[facility]    CSN: [id number] Arrival date & time: 12/03/16  1056     History   Chief Complaint Chief Complaint  Patient presents with  . Insect Bite    HPI Judy Wells is a 82 y.o. female.   82 year old female with history of CAD, diabetes, GERD, hypertension, comes in for 2 hour history of swelling of the lower lip and fingers due to bee sting. She denies any trouble breathing, swelling of the throat, trouble swallowing. She has not taken anything for the swelling, but has rubbed a mixture of baking soda vinegar on affected area. She does not think swelling is worsening. She states diabetes is well controlled, last a1c 6.7.       Past Medical History:  Diagnosis Date  . Anemia   . Coronary artery disease 1980's  . Diabetes mellitus type II   . Diverticulosis 2011  . GERD (gastroesophageal reflux disease)   . H. pylori infection 11/2011  . Hiatal hernia   . HTN (hypertension)   . Hypothyroid   . Myocardial infarction (HCC)    secondary to WPW  . Osteoarthritis   . Osteopenia   . Vitamin D deficiency   . Wolf-Parkinson-White syndrome 1999   s/p ablation in 1997 (Dr. Odessa FlemingS. Klein) - after cardiac arrest while in [place]    Patient Active Problem List   Diagnosis Date Noted  . Right foot pain 10/16/2016  . Tremor 08/22/2015  . DM2 (diabetes mellitus, type 2) (HCC) 08/17/2014  . Hypothyroidism 08/17/2014  . Throat discomfort 11/16/2013  . WPW (Wolff-Parkinson-White syndrome) 07/28/2013  . DOE (dyspnea on exertion) 07/28/2013  . GERD (gastroesophageal reflux disease) 07/28/2013  . Anemia in chronic illness     Past Surgical History:  Procedure Laterality Date  . ABDOMINAL ADHESION SURGERY    . ABLATION  05/19/1997   Dr. Graciela HusbandsKlein - bidirectional conduction of right posterolateral AT - RF ablation  . APPENDECTOMY    . BREAST SURGERY     breast lump removed - tiny  . CARDIAC CATHETERIZATION  02/19/1997   noraml L main, LAD free of disease, diagonal  with no obstructions, L Cfx free of disease (along with OM & PDA being normal), RCA small & non-dominant & free of disease (Dr. Langston ReusingA. Little)  . CATARACT EXTRACTION    . COLONOSCOPY    . NM MYOCAR PERF WALL MOTION  01/2012   lexiscan myoview - normal pattern of perfusion in all regions, EF 64%    OB History    Gravida Para Term Preterm AB Living   5 4     1 4    SAB TAB Ectopic Multiple Live Births   1               Home Medications    Prior to Admission medications   Medication Sig Start Date End Date Taking? Authorizing Provider  OVER THE COUNTER MEDICATION Patient takes a pill ordered by neurologist for left arm tremor   Yes [provider]  aspirin 81 MG tablet Take 81 mg by mouth.    [provider]  Calcium Carbonate-Vitamin D 600-400 MG-UNIT per tablet Take 1 tablet by mouth.    [provider]  FLAXSEED, LINSEED, PO Take by mouth.    [provider]  fluticasone (FLONASE) 50 MCG/ACT nasal spray Instill 1 spray into each nostril QAM 04/17/16   [provider]  glipiZIDE (GLUCOTROL) 10 MG tablet Take 10 mg by mouth daily.  [provider]  IRON PO Take by mouth. Slow release    [provider]  levothyroxine (SYNTHROID, LEVOTHROID) 100 MCG tablet Take 100 mcg by mouth daily before breakfast.    [provider]  metFORMIN (GLUCOPHAGE) 1000 MG tablet Take 1,000 mg by mouth 2 (two) times daily with a meal.     [provider]  predniSONE (STERAPRED UNI-PAK 21 TAB) 10 MG (21) TBPK tablet Take by mouth daily. Take 6 tabs by mouth day 1, then 5 tabs, then 4 tabs, then 3 tabs, 2 tabs, then 1 tab for the last day 12/03/16   Cathie Hoops, Amy V, PA-C  propranolol ER (INDERAL LA) 60 MG 24 hr capsule Take 1 capsule (60 mg total) by mouth daily. 10/16/16   Hilty, Lisette Abu, MD    Family History Family History  Problem Relation Age of Onset  . Dementia Mother   . Stroke Father   . Stroke Maternal Grandmother   .  Aneurysm Sister   . Heart disease Cousin   . Stroke Sister     Social History Social History  Substance Use Topics  . Smoking status: Never Smoker  . Smokeless tobacco: Never Used  . Alcohol use 0.0 oz/week     Comment: Rare     Allergies   Atorvastatin and Pravachol [pravastatin sodium]   Review of Systems Review of Systems  HENT: Negative for trouble swallowing and voice change.   Respiratory: Negative for cough, choking, chest tightness, shortness of breath and wheezing.   Cardiovascular: Negative for chest pain, palpitations and leg swelling.     Physical Exam Triage Vital Signs ED Triage Vitals [12/03/16 1200]  Enc Vitals Group     BP (!) 132/56     Pulse Rate 61     Resp (!) 22     Temp 98.4 F (36.9 C)     Temp Source Oral     SpO2 98 %     Weight      Height      Head Circumference      Peak Flow      Pain Score      Pain Loc      Pain Edu?      Excl. in GC?    No data found.   Updated Vital Signs BP (!) 132/56 (BP Location: Right Arm)   Pulse 61   Temp 98.4 F (36.9 C) (Oral)   Resp (!) 22   SpO2 98%    Physical Exam  Constitutional: She is oriented to person, place, and time. She appears well-developed and well-nourished. No distress.  Talking comfortably in full sentences.   HENT:  Mouth/Throat: Uvula is midline, oropharynx is clear and moist and mucous membranes are normal. No uvula swelling. No posterior oropharyngeal edema or posterior oropharyngeal erythema.  Swelling of the lower lip and surrounding tissue.   Neck: Trachea normal and normal range of motion. Neck supple. No edema and normal range of motion present.  Cardiovascular: Normal rate, regular rhythm and normal heart sounds.  Exam reveals no gallop and no friction rub.   No murmur heard. Pulmonary/Chest: Effort normal and breath sounds normal. No respiratory distress. She has no decreased breath sounds. She has no wheezes. She has no rhonchi. She has no rales.    Musculoskeletal:  Swelling of the right index finger due to bee sting. Full ROM. Normal strength. Sensation intact. Radial pulses 2+.   Neurological: She is alert and oriented to person, place, and  time.  Skin: Skin is warm and dry.     UC Treatments / Results  Labs (all labs ordered are listed, but only abnormal results are displayed) Labs Reviewed - No data to display  EKG  EKG Interpretation None       Radiology No results found.  Procedures Procedures (including critical care time)  Medications Ordered in UC Medications  methylPREDNISolone sodium succinate (SOLU-MEDROL) 125 mg/2 mL injection 125 mg (125 mg Intramuscular Given 12/03/16 1301)  diphenhydrAMINE (BENADRYL) injection 25 mg (25 mg Intramuscular Given 12/03/16 1304)     Initial Impression / Assessment and Plan / UC Course  I have reviewed the triage vital signs and the nursing notes.  Pertinent labs & imaging results that were available during my care of the patient were reviewed by me and considered in my medical decision making (see chart for details).     Patient without signs of respiratory distress/airway compromise. Solumedrol and benadryl injection given in office. Prednisone as directed, otc antihistamines for swelling. Ice compress for swelling and itching. Patient to contact PCP about prednisone use as she is a diabetic, discussed increased in blood glucose with prednisone use. Strict return precautions given to patient.   Final Clinical Impressions(s) / UC Diagnoses   Final diagnoses:  Bee sting reaction, accidental or unintentional, initial encounter    New Prescriptions Discharge Medication List as of 12/03/2016 12:57 PM    START taking these medications   Details  predniSONE (STERAPRED UNI-PAK 21 TAB) 10 MG (21) TBPK tablet Take by mouth daily. Take 6 tabs by mouth day 1, then 5 tabs, then 4 tabs, then 3 tabs, 2 tabs, then 1 tab for the last day, Starting Mon 12/03/2016, Normal           Belinda Fisher, PA-C 12/03/16 2221

## 2016-12-03 NOTE — ED Triage Notes (Signed)
Bee stings occurred 2 hours ago-10:00 am today.  Patient's chin, and lower lip are swollen.  Tongue is NOT swollen.patient says there is NO swelling in throat and is breathing normally states patient

## 2016-12-03 NOTE — Discharge Instructions (Signed)
You were given solumedrol and benadryl injection in office. Start prednisone as directed. Take over the counter antihistamines (benadryl, zyrtec, allegra, etc.) for swelling and itching. You can use ice compress for swelling and itching. Contact your PCP and update him of prednisone use, as it can elevate your blood glucose. Monitor for any worsening swelling, swelling of the throat/tongue, trouble breathing, trouble swallowing, call 911 and go to the emergency department immediately.

## 2017-03-25 ENCOUNTER — Ambulatory Visit (HOSPITAL_COMMUNITY)
Admission: EM | Admit: 2017-03-25 | Discharge: 2017-03-25 | Disposition: A | Discharge status: Home / Self Care | Payer: Medicare Other | Attending: Emergency Medicine | Admitting: Emergency Medicine

## 2017-03-25 ENCOUNTER — Other Ambulatory Visit: Payer: Self-pay

## 2017-03-25 ENCOUNTER — Encounter (HOSPITAL_COMMUNITY): Payer: Self-pay | Admitting: Emergency Medicine

## 2017-03-25 DIAGNOSIS — E119 Type 2 diabetes mellitus without complications: Secondary | ICD-10-CM | POA: Diagnosis not present

## 2017-03-25 DIAGNOSIS — M25561 Pain in right knee: Secondary | ICD-10-CM | POA: Diagnosis not present

## 2017-03-25 DIAGNOSIS — M5441 Lumbago with sciatica, right side: Secondary | ICD-10-CM

## 2017-03-25 LAB — GLUCOSE, CAPILLARY: Glucose-Capillary: 215 mg/dL — ABNORMAL HIGH (ref 65–99)

## 2017-03-25 MED ORDER — PREDNISONE 20 MG PO TABS: 40.0000 mg | ORAL_TABLET | Freq: Every day | ORAL | 0 refills | Status: AC

## 2017-03-25 MED ORDER — PREDNISONE 20 MG PO TABS
ORAL_TABLET | ORAL | Status: AC
  Filled 2017-03-25: qty 3

## 2017-03-25 MED ORDER — PREDNISONE 20 MG PO TABS
60.0000 mg | ORAL_TABLET | Freq: Once | ORAL | Status: AC
  Administered 2017-03-25: 60 mg via ORAL

## 2017-03-25 NOTE — Discharge Instructions (Signed)
Light activity as tolerated. Ice to right knee. Tylenol as needed for pain control Bland diet as tolerated, advance as tolerated. Push fluids to ensure adequate hydration. 5 days of prednisone to help with pain.  Follow up with PCP or orthopedist for recheck, may benefit from joint injection in the future.

## 2017-03-25 NOTE — ED Triage Notes (Signed)
Pt states "I have osteoarthritis in my R knee for two years and it hurts worse today and sometimes I get a shot for the pain.". Pt also endorses abdominal cramping with two times diarrhea.

## 2017-03-25 NOTE — ED Provider Notes (Signed)
[facility]    CSN: [id number] Arrival date & time: 03/25/17  1801     History   Chief Complaint Chief Complaint  Patient presents with  . Knee Pain  . Abdominal Pain    HPI Judy Wells is a 82 y.o. female.   Judy Wells presents with family with complaints of right knee and low back pain. Pain has worsened since 12/1. Without specific injury or fall. Took tylenol and aleve yesterday which did not help. Pain is with activity, radiates from back down thigh and to knee. Pain also when knees hit one another. Has had 5 different right knee injections in the past, which have provided short term relief. Today she did not take any medication because she felt she had an upset stomach and had two episodes of diarrhea. Without blood. Denies current nausea, vomiting, abdominal pain. Has eaten crackers and ginger ale and has tolerated. She is ambulatory. Applied topical icy hot patch to right knee.    ROS per HPI.       Past Medical History:  Diagnosis Date  . Anemia   . Coronary artery disease 1980's  . Diabetes mellitus type II   . Diverticulosis 2011  . GERD (gastroesophageal reflux disease)   . H. pylori infection 11/2011  . Hiatal hernia   . HTN (hypertension)   . Hypothyroid   . Myocardial infarction (HCC)    secondary to WPW  . Osteoarthritis   . Osteopenia   . Vitamin D deficiency   . Wolf-Parkinson-White syndrome 1999   s/p ablation in 1997 (Dr. Odessa Fleming) - after cardiac arrest while in [place]    Patient Active Problem List   Diagnosis Date Noted  . Right foot pain 10/16/2016  . Tremor 08/22/2015  . DM2 (diabetes mellitus, type 2) (HCC) 08/17/2014  . Hypothyroidism 08/17/2014  . Throat discomfort 11/16/2013  . WPW (Wolff-Parkinson-White syndrome) 07/28/2013  . DOE (dyspnea on exertion) 07/28/2013  . GERD (gastroesophageal reflux disease) 07/28/2013  . Anemia in chronic illness     Past Surgical History:  Procedure Laterality Date  . ABDOMINAL  ADHESION SURGERY    . ABLATION  05/19/1997   Dr. Graciela Husbands - bidirectional conduction of right posterolateral AT - RF ablation  . APPENDECTOMY    . BREAST SURGERY     breast lump removed - tiny  . CARDIAC CATHETERIZATION  02/19/1997   noraml L main, LAD free of disease, diagonal with no obstructions, L Cfx free of disease (along with OM & PDA being normal), RCA small & non-dominant & free of disease (Dr. Langston Reusing)  . CATARACT EXTRACTION    . COLONOSCOPY    . NM MYOCAR PERF WALL MOTION  01/2012   lexiscan myoview - normal pattern of perfusion in all regions, EF 64%    OB History    Gravida Para Term Preterm AB Living   5 4     1 4    SAB TAB Ectopic Multiple Live Births   1               Home Medications    Prior to Admission medications   Medication Sig Start Date End Date Taking? Authorizing Provider  aspirin 81 MG tablet Take 81 mg by mouth.    [provider]  Calcium Carbonate-Vitamin D 600-400 MG-UNIT per tablet Take 1 tablet by mouth.    [provider]  FLAXSEED, LINSEED, PO Take by mouth.    [provider]  fluticasone (FLONASE) 50 MCG/ACT  nasal spray Instill 1 spray into each nostril QAM 04/17/16   [provider]  glipiZIDE (GLUCOTROL) 10 MG tablet Take 10 mg by mouth daily.    [provider]  IRON PO Take by mouth. Slow release    [provider]  levothyroxine (SYNTHROID, LEVOTHROID) 100 MCG tablet Take 100 mcg by mouth daily before breakfast.    [provider]  metFORMIN (GLUCOPHAGE) 1000 MG tablet Take 1,000 mg by mouth 2 (two) times daily with a meal.     [provider]  OVER THE COUNTER MEDICATION Patient takes a pill ordered by neurologist for left arm tremor    [provider]  predniSONE (DELTASONE) 20 MG tablet Take 2 tablets (40 mg total) by mouth daily with breakfast for 5 days. 03/25/17 03/30/17  Georgetta HaberBurky, Natalie B, NP  propranolol ER (INDERAL LA) 60 MG 24 hr capsule Take 1 capsule  (60 mg total) by mouth daily. 10/16/16   Hilty, Lisette AbuKenneth C, MD    Family History Family History  Problem Relation Age of Onset  . Dementia Mother   . Stroke Father   . Stroke Maternal Grandmother   . Aneurysm Sister   . Heart disease Cousin   . Stroke Sister     Social History Social History   Tobacco Use  . Smoking status: Never Smoker  . Smokeless tobacco: Never Used  Substance Use Topics  . Alcohol use: Yes    Alcohol/week: 0.0 oz    Comment: Rare  . Drug use: No     Allergies   Atorvastatin and Pravachol [pravastatin sodium]   Review of Systems Review of Systems   Physical Exam Triage Vital Signs ED Triage Vitals [03/25/17 1854]  Enc Vitals Group     BP 114/67     Pulse Rate 60     Resp 18     Temp 98.6 F (37 C)     Temp Source Oral     SpO2 100 %     Weight      Height      Head Circumference      Peak Flow      Pain Score 10     Pain Loc      Pain Edu?      Excl. in GC?    No data found.  Updated Vital Signs BP 114/67   Pulse 60   Temp 98.6 F (37 C) (Oral)   Resp 18   SpO2 100%   Visual Acuity Right Eye Distance:   Left Eye Distance:   Bilateral Distance:    Right Eye Near:   Left Eye Near:    Bilateral Near:     Physical Exam  Constitutional: She is oriented to person, place, and time. She appears well-developed and well-nourished. No distress.  Cardiovascular: Normal rate, regular rhythm and normal heart sounds.  Pulmonary/Chest: Effort normal and breath sounds normal.  Abdominal: Soft. Normal appearance. There is no tenderness.  Musculoskeletal:       Right knee: She exhibits normal range of motion, no swelling, no effusion, no deformity and no laceration. Tenderness found. Medial joint line and MCL tenderness noted.       Lumbar back: She exhibits decreased range of motion, tenderness and pain. She exhibits no bony tenderness and no swelling.  Bilateral low back pain reproducible with palpation to musculature. Ambulatory.  Right knee with mild swelling; without redness, firmness, warm. Pedal pulse strong; strength to bilateral lower extremities equal. Sensation intact  Neurological: She is alert and oriented to person, place, and time.  Skin: Skin is warm and dry.     UC Treatments / Results  Labs (all labs ordered are listed, but only abnormal results are displayed) Labs Reviewed - No data to display  EKG  EKG Interpretation None       Radiology No results found.  Procedures Procedures (including critical care time)  Medications Ordered in UC Medications  predniSONE (DELTASONE) tablet 60 mg (not administered)     Initial Impression / Assessment and Plan / UC Course  I have reviewed the triage vital signs and the nursing notes.  Pertinent labs & imaging results that were available during my care of the patient were reviewed by me and considered in my medical decision making (see chart for details).     Back pain radiating to knee vs knee pain radiating to back vs back and knee pain. Knee pain appears to be chronic in nature. Medication options limited to age, stomach upset and medical history. Discussed monitoring of blood sugar with short course of steroids to try to help with inflammation. May benefit from future joint injection to right knee, to follow with PCP. Light activity as tolerated to help back pain. Return precautions provided. Patient and family verbalized understanding and agreeable to plan.   Family concerned of possible low blood sugar due to diarrhea, 215 in clinic today, patient has not taken her medications. Encouraged to do so, especially with prednisone on board.      Final Clinical Impressions(s) / UC Diagnoses   Final diagnoses:  Right knee pain, unspecified chronicity  Bilateral low back pain with right-sided sciatica, unspecified chronicity    ED Discharge Orders        Ordered    predniSONE (DELTASONE) 20 MG tablet  Daily with breakfast     03/25/17 2008         Controlled Substance Prescriptions [place] Controlled Substance Registry consulted? Not Applicable   Georgetta HaberBurky, Natalie B, NP 03/25/17 2017

## 2017-11-12 ENCOUNTER — Encounter: Payer: Self-pay | Admitting: Internal Medicine

## 2017-11-12 ENCOUNTER — Ambulatory Visit (INDEPENDENT_AMBULATORY_CARE_PROVIDER_SITE_OTHER): Payer: Medicare Other | Admitting: Internal Medicine

## 2017-11-12 VITALS — BP 126/74 | HR 65 | Ht 62.0 in | Wt 219.4 lb

## 2017-11-12 DIAGNOSIS — R635 Abnormal weight gain: Secondary | ICD-10-CM | POA: Insufficient documentation

## 2017-11-12 DIAGNOSIS — R0609 Other forms of dyspnea: Secondary | ICD-10-CM

## 2017-11-12 DIAGNOSIS — R0600 Dyspnea, unspecified: Secondary | ICD-10-CM

## 2017-11-12 DIAGNOSIS — I456 Pre-excitation syndrome: Secondary | ICD-10-CM | POA: Diagnosis not present

## 2017-11-12 DIAGNOSIS — R251 Tremor, unspecified: Secondary | ICD-10-CM

## 2017-11-12 NOTE — Patient Instructions (Signed)
Your physician wants you to follow-up in: ONE YEAR with Dr. Hilty. You will receive a reminder letter in the mail two months in advance. If you don't receive a letter, please call our office to schedule the follow-up appointment.  
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
## 2018-01-13 ENCOUNTER — Other Ambulatory Visit: Payer: Self-pay | Admitting: Internal Medicine

## 2018-01-13 DIAGNOSIS — I456 Pre-excitation syndrome: Secondary | ICD-10-CM

## 2018-05-08 ENCOUNTER — Telehealth: Payer: Self-pay | Admitting: Internal Medicine

## 2018-05-08 NOTE — Telephone Encounter (Signed)
New Message   Pt states she is calling to let the nurse know, she will be at the appt tomorrow at 8:30 with [facility]

## 2018-05-09 ENCOUNTER — Encounter: Payer: Self-pay | Admitting: Internal Medicine

## 2018-05-09 ENCOUNTER — Ambulatory Visit: Payer: Medicare Other | Admitting: Internal Medicine

## 2018-05-09 VITALS — BP 102/60 | HR 61 | Ht 62.0 in | Wt 223.8 lb

## 2018-05-09 DIAGNOSIS — Z79899 Other long term (current) drug therapy: Secondary | ICD-10-CM | POA: Diagnosis not present

## 2018-05-09 DIAGNOSIS — R0602 Shortness of breath: Secondary | ICD-10-CM | POA: Diagnosis not present

## 2018-05-09 DIAGNOSIS — R635 Abnormal weight gain: Secondary | ICD-10-CM | POA: Diagnosis not present

## 2018-05-09 NOTE — Telephone Encounter (Signed)
Acknowledged.
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
## 2018-05-09 NOTE — Patient Instructions (Signed)
Medication Instructions:  Continue current medications  If you need a refill on your cardiac medications before your next appointment, please call your pharmacy.   Lab work: BMET, BNP to be completed today  If you have labs (blood work) drawn today and your tests are completely normal, you will receive your results only by: Marland Kitchen MyChart Message (if you have MyChart) OR . A paper copy in the mail If you have any lab test that is abnormal or we need to change your treatment, we will call you to review the results.  Testing/Procedures: Your physician has requested that you have an echocardiogram. Echocardiography is a painless test that uses sound waves to create images of your heart. It provides your doctor with information about the size and shape of your heart and how well your heart's chambers and valves are working. This procedure takes approximately one hour. There are no restrictions for this procedure. -- done at [address] - [address] Floor  Follow-Up: At [facility], you and your health needs are our priority.  As part of our continuing mission to provide you with exceptional heart care, we have created designated Provider Care Teams.  These Care Teams include your primary Cardiologist (physician) and Advanced Practice Providers (APPs -  Physician Assistants and Nurse Practitioners) who all work together to provide you with the care you need, when you need it. You will need a follow up appointment after echocardiogram.  You may see Dr. Rennis Golden or one of the following Advanced Practice Providers on your designated Care Team: Azalee Course, [place] . Micah Flesher, PA-C  Any Other Special Instructions Will Be Listed Below (If Applicable).

## 2018-05-10 LAB — PRO B NATRIURETIC PEPTIDE: NT-Pro BNP: 180 pg/mL (ref 0–738)

## 2018-05-10 LAB — BASIC METABOLIC PANEL
BUN/Creatinine Ratio: 13 (ref 12–28)
BUN: 9 mg/dL (ref 8–27)
CO2: 22 mmol/L (ref 20–29)
Calcium: 9.2 mg/dL (ref 8.7–10.3)
Chloride: 103 mmol/L (ref 96–106)
Creatinine, Ser: 0.67 mg/dL (ref 0.57–1.00)
GFR calc Af Amer: 95 mL/min/{1.73_m2} (ref >59)
GFR calc non Af Amer: 83 mL/min/{1.73_m2} (ref >59)
Glucose: 138 mg/dL — ABNORMAL HIGH (ref 65–99)
Potassium: 4.8 mmol/L (ref 3.5–5.2)
Sodium: 140 mmol/L (ref 134–144)

## 2018-05-19 ENCOUNTER — Ambulatory Visit (HOSPITAL_COMMUNITY): Payer: Medicare Other | Attending: Internal Medicine

## 2018-05-19 DIAGNOSIS — R0602 Shortness of breath: Secondary | ICD-10-CM | POA: Diagnosis present

## 2018-06-17 ENCOUNTER — Encounter: Payer: Self-pay | Admitting: Internal Medicine

## 2018-06-17 ENCOUNTER — Ambulatory Visit: Payer: Medicare Other | Admitting: Internal Medicine

## 2018-06-17 VITALS — BP 112/62 | HR 74 | Ht 62.0 in | Wt 226.0 lb

## 2018-06-17 DIAGNOSIS — K219 Gastro-esophageal reflux disease without esophagitis: Secondary | ICD-10-CM

## 2018-06-17 DIAGNOSIS — R0602 Shortness of breath: Secondary | ICD-10-CM | POA: Diagnosis not present

## 2018-06-17 DIAGNOSIS — I456 Pre-excitation syndrome: Secondary | ICD-10-CM

## 2018-06-17 DIAGNOSIS — R251 Tremor, unspecified: Secondary | ICD-10-CM | POA: Diagnosis not present
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
## 2018-06-17 NOTE — Patient Instructions (Signed)
Medication Instructions:  Take Tylenol PM at bedtime If you need a refill on your cardiac medications before your next appointment, please call your pharmacy.   Lab work: None ordered   Testing/Procedures: None ordered  Follow-Up: At [facility], you and your health needs are our priority.  As part of our continuing mission to provide you with exceptional heart care, we have created designated Provider Care Teams.  These Care Teams include your primary Cardiologist (physician) and Advanced Practice Providers (APPs -  Physician Assistants and Nurse Practitioners) who all work together to provide you with the care you need, when you need it. . Follow up with Dr.Hilty in 1 year  Call 3 months before to schedule

## 2018-11-26 ENCOUNTER — Telehealth: Payer: Self-pay | Admitting: Internal Medicine

## 2018-11-26 NOTE — Telephone Encounter (Signed)
Returned call to patient of Dr. Debara Pickett.   She reports chest tightness off and on for a few days. She reports she is coughing up phlegm and has chronic bronchitis. She think she may have a "cold". She asked about mucinex - advised OK to take but NOT with the -D or -DM at the end of the name. She reports shortness of breath off and on for 2 year - nothing new.  -- Will route to DOD to advise.    Patient would also like to know if it is OK to take prevagen for memory - will route to CVRR

## 2018-11-26 NOTE — Telephone Encounter (Signed)
New Message       Pt c/o of Chest Pain: STAT if CP now or developed within 24 hours  1. Are you having CP right now?  Chest tightness   2. Are you experiencing any other symptoms (ex. SOB,nausea, vomiting, sweating)? No  3. How long have you been experiencing CP? A couple of days   4. Is your CP continuous or coming and going? Coming and going   5. Have you taken Nitroglycerin? No    Pt said she had bronchitis and doesn't know if the chest tightness is from that  Please call  ?

## 2018-11-26 NOTE — Telephone Encounter (Signed)
Forward to Dr Debara Pickett; no acute changes Judy Wells

## 2018-11-26 NOTE — Telephone Encounter (Signed)
Patient advised no acute changes. Will follow up with MD on 8/18 for virtual visit

## 2018-12-09 ENCOUNTER — Telehealth (INDEPENDENT_AMBULATORY_CARE_PROVIDER_SITE_OTHER): Payer: Medicare Other | Admitting: Internal Medicine

## 2018-12-09 VITALS — Ht 62.0 in | Wt 223.0 lb

## 2018-12-09 DIAGNOSIS — R0789 Other chest pain: Secondary | ICD-10-CM

## 2018-12-09 DIAGNOSIS — R0602 Shortness of breath: Secondary | ICD-10-CM | POA: Diagnosis not present

## 2018-12-09 DIAGNOSIS — R251 Tremor, unspecified: Secondary | ICD-10-CM

## 2018-12-09 DIAGNOSIS — I456 Pre-excitation syndrome: Secondary | ICD-10-CM | POA: Diagnosis not present

## 2018-12-09 NOTE — Progress Notes (Signed)
Virtual Visit via Telephone Note   This visit type was conducted due to national recommendations for restrictions regarding the COVID-19 Pandemic (e.g. social distancing) in an effort to limit this patient's exposure and mitigate transmission in our community.  Due to her co-morbid illnesses, this patient is at least at moderate risk for complications without adequate follow up.  This format is felt to be most appropriate for this patient at this time.  The patient did not have access to video technology/had technical difficulties with video requiring transitioning to audio format only (telephone).  All issues noted in this document were discussed and addressed.  No physical exam could be performed with this format.  Please refer to the patient's chart for her  consent to telehealth for [facility].   Evaluation Performed:  Telephone follow-up  Date:  12/09/2018   ID:  Judy Wells, DOB 1935/02/13, MRN [id number]  Patient Location:  [address] [place] [address]  Provider location:   [address], [address] [place], [place] [address]  PCP:  Tracey HarriesBouska, David, MD  Cardiologist:  Chrystie NoseKenneth C Norrine Ballester, MD Electrophysiologist:  None   Chief Complaint:  Chest pain  History of Present Illness:    Judy Wells is a 84 y.o. female who presents via audio/video conferencing for a telehealth visit today.  Ms. Judy Wells is a 84 year old female with a history of shortness of breath and grade 2 diastolic dysfunction, history of WPW status post ablation, type 2 diabetes, obesity, reflux and tremor.  Recently she has had some chest discomfort.  She describes it as an ache and seems to be worse when laying down at night.  She takes Tylenol with some relief and is found some improvement with gabapentin.  She denies any recurrent palpitations.  Her tremor is mildly improved on the beta-blocker as well.  She is under a lot of stress caring for her husband who has memory loss.  The patient does  not have symptoms concerning for COVID-19 infection (fever, chills, cough, or new SHORTNESS OF BREATH).    Prior CV studies:   The following studies were reviewed today:  Chart reviewed  PMHx:  Past Medical History:  Diagnosis Date  . Anemia   . Coronary artery disease 1980's  . Diabetes mellitus type II   . Diverticulosis 2011  . GERD (gastroesophageal reflux disease)   . H. pylori infection 11/2011  . Hiatal hernia   . HTN (hypertension)   . Hypothyroid   . Myocardial infarction (HCC)    secondary to WPW  . Osteoarthritis   . Osteopenia   . Vitamin D deficiency   . Wolf-Parkinson-White syndrome 1999   s/p ablation in 1997 (Dr. Odessa FlemingS. Klein) - after cardiac arrest while in [place]    Past Surgical History:  Procedure Laterality Date  . ABDOMINAL ADHESION SURGERY    . ABLATION  05/19/1997   Dr. Graciela HusbandsKlein - bidirectional conduction of right posterolateral AT - RF ablation  . APPENDECTOMY    . BREAST SURGERY     breast lump removed - tiny  . CARDIAC CATHETERIZATION  02/19/1997   noraml L main, LAD free of disease, diagonal with no obstructions, L Cfx free of disease (along with OM & PDA being normal), RCA small & non-dominant & free of disease (Dr. Langston ReusingA. Little)  . CATARACT EXTRACTION    . COLONOSCOPY    . NM MYOCAR PERF WALL MOTION  01/2012   lexiscan myoview - normal pattern of perfusion in all regions, EF  64%    FAMHx:  Family History  Problem Relation Age of Onset  . Dementia Mother   . Stroke Father   . Stroke Maternal Grandmother   . Aneurysm Sister   . Heart disease Cousin   . Stroke Sister     SOCHx:   reports that she has never smoked. She has never used smokeless tobacco. She reports current alcohol use. She reports that she does not use drugs.  ALLERGIES:  Allergies  Allergen Reactions  . Atorvastatin Other (See Comments)    Pain and falls  . Pravachol [Pravastatin Sodium] Itching    MEDS:  Current Meds  Medication Sig  . acetaminophen (TYLENOL) 650  MG CR tablet Take 650 mg by mouth every 8 (eight) hours as needed for pain.  Marland Kitchen Apoaequorin (PREVAGEN) 10 MG CAPS Take by mouth.  Marland Kitchen aspirin 81 MG tablet Take 81 mg by mouth daily.   Marland Kitchen FLAXSEED, LINSEED, PO Take by mouth.  . gabapentin (NEURONTIN) 100 MG capsule Take 100 mg by mouth 2 (two) times daily.  Marland Kitchen glipiZIDE (GLUCOTROL) 10 MG tablet Take 10 mg by mouth daily.  . IRON PO Take by mouth. Slow release  . levothyroxine (SYNTHROID, LEVOTHROID) 112 MCG tablet Take 100 mcg by mouth daily before breakfast.   . metFORMIN (GLUCOPHAGE) 1000 MG tablet Take 1,000 mg by mouth 2 (two) times daily with a meal.   . propranolol ER (INDERAL LA) 60 MG 24 hr capsule TAKE 1 CAPSULE BY MOUTH EVERY DAY     ROS: Pertinent items noted in HPI and remainder of comprehensive ROS otherwise negative.  Labs/Other Tests and Data Reviewed:    Recent Labs: 05/09/2018: BUN 9; Creatinine, Ser 0.67; NT-Pro BNP 180; Potassium 4.8; Sodium 140   Recent Lipid Panel No results found for: CHOL, TRIG, HDL, CHOLHDL, LDLCALC, LDLDIRECT  Wt Readings from Last 3 Encounters:  12/09/18 223 lb (101.2 kg)  06/17/18 226 lb (102.5 kg)  05/09/18 223 lb 12.8 oz (101.5 kg)     Exam:    Vital Signs:  Ht 5\' 2"  (1.575 m)   Wt 223 lb (101.2 kg)   BMI 40.79 kg/m    Exam not performed due to telephone visit  ASSESSMENT & PLAN:    1. Atypical chest pain 2. Shortness of breath with exertion -LVEF 60 to 65%, moderate LAE (04/2018) 3. History of WPW status post ablation 4. DM2 5. GERD 6. Tremor - improved on b-blocker  Mrs. Judy Wells is describing atypical chest pain which does not sound like angina.  It seems to be resolved with Tylenol or gabapentin.  She has had shortness of breath with exertion and this is likely due to diastolic dysfunction as her LVEF was normal by echo in January and she had grade 2 diastolic dysfunction.  She is on a beta-blocker to help with her tremor and palpitations as well as to keep her heart rate lower.   Overall I think she is doing fairly well.  No recurrent palpitations.  COVID-19 Education: The signs and symptoms of COVID-19 were discussed with the patient and how to seek care for testing (follow up with PCP or arrange E-visit).  The importance of social distancing was discussed today.  Patient Risk:   After full review of this patients clinical status, I feel that they are at least moderate risk at this time.  Time:   Today, I have spent 25 minutes with the patient with telehealth technology discussing chest pain, shortness of breath, diastolic heart failure,  history of WPW, tremor.     Medication Adjustments/Labs and Tests Ordered: Current medicines are reviewed at length with the patient today.  Concerns regarding medicines are outlined above.   Tests Ordered: No orders of the defined types were placed in this encounter.   Medication Changes: No orders of the defined types were placed in this encounter.   Disposition:  in 6 month(s)  Chrystie NoseKenneth C. Jomes Giraldo, MD, [facility], FACP  [place]  [facility]  Medical Director of the Advanced Lipid Disorders &  Cardiovascular Risk Reduction Clinic Diplomate of the American Board of Clinical Lipidology Attending Cardiologist  Direct Dial: [phone]  Fax: [phone]  Website:  [email or web address]  Chrystie NoseKenneth C Dezra Mandella, MD  12/09/2018 4:43 PM

## 2018-12-09 NOTE — Patient Instructions (Signed)
Follow-Up: You will need a follow up appointment in 6 months-IN OUR OFFICE.  Please call our office 2 months in advance, December 2020 to schedule this February 2021 appointment.  You may see Judy Casino, MD or one of the following Advanced Practice Providers on your designated Care Team:   Almyra Deforest, PA-C  Fabian Sharp, [place]       Medication Instructions:  The current medical regimen is effective;  continue present plan and medications as directed. Please refer to the Current Medication list given to you today. If you need a refill on your cardiac medications before your next appointment, please call your pharmacy. Labwork: When you have labs (blood work) and your tests are completely normal, you will receive your results ONLY by [place] (if you have MyChart) -OR- A paper copy in the mail.  At [facility], you and your health needs are our priority.  As part of our continuing mission to provide you with exceptional heart care, we have created designated Provider Care Teams.  These Care Teams include your primary Cardiologist (physician) and Advanced Practice Providers (APPs -  Physician Assistants and Nurse Practitioners) who all work together to provide you with the care you need, when you need it.  Thank you for choosing [facility] at [facility]!!

## 2019-01-29 ENCOUNTER — Encounter: Payer: Self-pay | Admitting: Gynecology

## 2019-05-07 ENCOUNTER — Ambulatory Visit (INDEPENDENT_AMBULATORY_CARE_PROVIDER_SITE_OTHER): Payer: Medicare Other | Admitting: Physician Assistant

## 2019-05-07 ENCOUNTER — Encounter: Payer: Self-pay | Admitting: Physician Assistant

## 2019-05-07 ENCOUNTER — Telehealth: Payer: Self-pay

## 2019-05-07 ENCOUNTER — Telehealth: Payer: Self-pay | Admitting: Internal Medicine

## 2019-05-07 ENCOUNTER — Encounter (INDEPENDENT_AMBULATORY_CARE_PROVIDER_SITE_OTHER): Payer: Self-pay

## 2019-05-07 ENCOUNTER — Ambulatory Visit
Admission: RE | Admit: 2019-05-07 | Discharge: 2019-05-07 | Disposition: A | Discharge status: Home / Self Care | Payer: Medicare Other | Source: Ambulatory Visit | Attending: Physician Assistant | Admitting: Physician Assistant

## 2019-05-07 ENCOUNTER — Other Ambulatory Visit: Payer: Self-pay

## 2019-05-07 VITALS — Temp 97.9°F | Ht 62.0 in | Wt 228.4 lb

## 2019-05-07 DIAGNOSIS — E039 Hypothyroidism, unspecified: Secondary | ICD-10-CM | POA: Diagnosis not present

## 2019-05-07 DIAGNOSIS — E119 Type 2 diabetes mellitus without complications: Secondary | ICD-10-CM | POA: Diagnosis not present

## 2019-05-07 DIAGNOSIS — R0600 Dyspnea, unspecified: Secondary | ICD-10-CM

## 2019-05-07 DIAGNOSIS — R079 Chest pain, unspecified: Secondary | ICD-10-CM | POA: Diagnosis not present

## 2019-05-07 DIAGNOSIS — I10 Essential (primary) hypertension: Secondary | ICD-10-CM | POA: Diagnosis not present

## 2019-05-07 DIAGNOSIS — R0609 Other forms of dyspnea: Secondary | ICD-10-CM

## 2019-05-07 DIAGNOSIS — I456 Pre-excitation syndrome: Secondary | ICD-10-CM

## 2019-05-07 LAB — TROPONIN I: Troponin I: <0.01 ng/mL (ref 0.00–0.04)

## 2019-05-07 NOTE — Telephone Encounter (Signed)
With the patients permission I called her daughter Charese Abundis and left a voice message for her to give me a call in the office so that I can inform her of what occurred at her mother's appointment. Will try calling her again.

## 2019-05-07 NOTE — Telephone Encounter (Signed)
    COVID-19 Pre-Screening Questions:  . In the past 7 to 10 days have you had a cough,  shortness of breath, headache, congestion, fever (100 or greater) body aches, chills, sore throat, or sudden loss of taste or sense of smell? Hx SOB . Have you been around anyone with known Covid 19? NO . Have you been around anyone who is awaiting Covid 19 test results in the past 7 to 10 days? NO . Have you been around anyone who has been exposed to Covid 19, or has mentioned symptoms of Covid 19 within the past 7 to 10 days? NO  If you have any concerns/questions about symptoms patients report during screening (either on the phone or at threshold). Contact the provider seeing the patient or DOD for further guidance.  If neither are available contact a member of the leadership team.   Incoming call from scheduling. Call transferred to triage nurse. Pt states she has had discomfort to right sided chest and right shoulder for 2 days. Describes right sided chest discomfort as 'tightness' and rates as 8 on a scale from 1-10. She states last night a bit different than usual because she could not sleep d/t discomfort. Episodes last for 'an hour or so'. Pt has no additional symptoms during episodes except SOB which she states has increased over time. She states she is more SOB during the day especially with exertion. Pt states no current chest/right shoulder discomfort. She states she notices chest tightness and shoulder discomfort more when lying down during the evening. She does have Hx GERD. Pt appt set for 05/07/2019 with Lisabeth Devoid, PA-C at 1:30 pm

## 2019-05-07 NOTE — Patient Instructions (Addendum)
Medication Instructions:  Your physician recommends that you continue on your current medications as directed. Please refer to the Current Medication list given to you today.  *If you need a refill on your cardiac medications before your next appointment, please call your pharmacy*  Lab Work: Your physician recommends that you return for lab work TODAY:  STAT TROPONIN  If you have labs (blood work) drawn today and your tests are completely normal, you will receive your results only by: Marland Kitchen MyChart Message (if you have MyChart) OR . A paper copy in the mail If you have any lab test that is abnormal or we need to change your treatment, we will call you to review the results.  Testing/Procedures: Your physician has requested that you have a lexiscan myoview. For further information please visit [email or web address] Please follow instruction sheet, as given.  PLEASE SCHEDULE FOR 05/08/19 OR 05/11/19 DEPENDING ON THE LAB RESULTS  A chest x-ray takes a picture of the organs and structures inside the chest, including the heart, lungs, and blood vessels. This test can show several things, including, whether the heart is enlarges; whether fluid is building up in the lungs; and whether pacemaker / defibrillator leads are still in place. [facility] [address]  Follow-Up: At [facility], you and your health needs are our priority.  As part of our continuing mission to provide you with exceptional heart care, we have created designated Provider Care Teams.  These Care Teams include your primary Cardiologist (physician) and Advanced Practice Providers (APPs -  Physician Assistants and Nurse Practitioners) who all work together to provide you with the care you need, when you need it.  Your next appointment:   2 week(s)  The format for your next appointment:   In Person  Provider:   K. [place] Hilty, MD  Other Instructions

## 2019-05-07 NOTE — Progress Notes (Signed)
I have called the patient to give her the result, chest x ray negative

## 2019-05-07 NOTE — Progress Notes (Signed)
Cardiology Office Note:    Date:  05/07/2019   ID:  [name], DOB 1935-02-01, MRN [id number]  PCP:  Bernerd Limbo, MD  Cardiologist:  Pixie Casino, MD  Electrophysiologist:  None   Referring MD: Bernerd Limbo, MD   Chief Complaint  Patient presents with  . Follow-up    evaluation of chest pain for Dr. Debara Pickett. Patient seen along with Dr. Percival Spanish    History of Present Illness:    Judy Wells is a 85 y.o. female with a hx of DM 2, hypertension, hypothyroidism and history of Wolff-Parkinson-White syndrome s/p ablation in 1997.  Previous cardiac catheterization by Dr. Chase Picket on 02/19/1997 demonstrated EF 61%, no evidence of coronary artery disease with left dominant system. Myoview obtained in October 2013 showed normal EF 64%, normal perfusion without ischemia or previous history of infarction.  Most recent echocardiogram obtained on 05/19/2018 showed EF 60 to 65%, moderate LAE, mild MR.  She was last seen by Dr. Debara Pickett via virtual visit on 12/09/2018, at the time, she had atypical chest pain.  No further work-up was recommended.  Patient presents today for evaluation of chest tightness.  This has been intermittent for the past 2 days.  She also has been noticing worsening dyspnea.  She says she has dyspnea on exertion for over a year now, however in the last few days, this has been worsening.  She denies any fever, chill, or cough.  She denies any recent sick contact.  Her husband has dementia and that she has not left the house for over 2 weeks prior to today.  On physical exam, she does not appear to be volume overloaded.  Although there is a sore spot in the right neck, majority of her symptoms seems to be substernal chest pain that is not affected by deep inspiration, body rotation or palpation.  EKG does show Q waves in the lead III however this does not occur in adjacent lead.  I reviewed the EKG and her symptoms with Dr. Percival Spanish who has also seen the patient today.  He  recommended a chest x-ray.  We will also proceed with a static troponin in the clinic.  If troponin come back positive, patient will be instructed to go to the hospital tonight.  If troponin come back negative, we will proceed with urgent Myoview in the next few days.  Unfortunately given her body size and large breast size, coronary CT likely will not be a good choice in this case due to potential artifact.   Past Medical History:  Diagnosis Date  . Anemia   . Coronary artery disease 1980's  . Diabetes mellitus type II   . Diverticulosis 2011  . GERD (gastroesophageal reflux disease)   . H. pylori infection 11/2011  . Hiatal hernia   . HTN (hypertension)   . Hypothyroid   . Myocardial infarction ([place])    secondary to WPW  . Osteoarthritis   . Osteopenia   . Vitamin D deficiency   . Wolf-Parkinson-White syndrome 1999   s/p ablation in 1997 (Dr. Olin Pia) - after cardiac arrest while in [place]    Past Surgical History:  Procedure Laterality Date  . ABDOMINAL ADHESION SURGERY    . ABLATION  05/19/1997   Dr. Caryl Comes - bidirectional conduction of right posterolateral AT - RF ablation  . APPENDECTOMY    . BREAST SURGERY     breast lump removed - tiny  . CARDIAC CATHETERIZATION  02/19/1997   noraml L main,  LAD free of disease, diagonal with no obstructions, L Cfx free of disease (along with OM & PDA being normal), RCA small & non-dominant & free of disease (Dr. Langston Reusing)  . CATARACT EXTRACTION    . COLONOSCOPY    . NM MYOCAR PERF WALL MOTION  01/2012   lexiscan myoview - normal pattern of perfusion in all regions, EF 64%    Current Medications: No outpatient medications have been marked as taking for the 05/07/19 encounter (Office Visit) with Azalee Course, PA.     Allergies:   Atorvastatin and Pravachol [pravastatin sodium]   Social History   Socioeconomic History  . Marital status: Married    Spouse name: Richard  . Number of children: 4  . Years of education: 24  . Highest  education level: Not on file  Occupational History    Employer: [employer]  Tobacco Use  . Smoking status: Never Smoker  . Smokeless tobacco: Never Used  Substance and Sexual Activity  . Alcohol use: Yes    Alcohol/week: 0.0 standard drinks    Comment: Rare  . Drug use: No  . Sexual activity: Never    Comment: 1st intercourse 85 yo-Fewer than 5 partners  Other Topics Concern  . Not on file  Social History Narrative   Married, lives at home with husband   Caffeine use- coffee 3 cups daily   Social Determinants of Health   Financial Resource Strain:   . Difficulty of Paying Living Expenses: Not on file  Food Insecurity:   . Worried About Programme researcher, broadcasting/film/video in the Last Year: Not on file  . Ran Out of Food in the Last Year: Not on file  Transportation Needs:   . Lack of Transportation (Medical): Not on file  . Lack of Transportation (Non-Medical): Not on file  Physical Activity:   . Days of Exercise per Week: Not on file  . Minutes of Exercise per Session: Not on file  Stress:   . Feeling of Stress : Not on file  Social Connections:   . Frequency of Communication with Friends and Family: Not on file  . Frequency of Social Gatherings with Friends and Family: Not on file  . Attends Religious Services: Not on file  . Active Member of Clubs or Organizations: Not on file  . Attends Banker Meetings: Not on file  . Marital Status: Not on file     Family History: The patient's family history includes Aneurysm in her sister; Dementia in her mother; Heart disease in her cousin; Stroke in her father, maternal grandmother, and sister.  ROS:   Please see the history of present illness.     All other systems reviewed and are negative.  EKGs/Labs/Other Studies Reviewed:    The following studies were reviewed today:  Echo 05/19/2018 IMPRESSIONS    1. The left ventricle is normal in size with a 60-65% ejection fraction.  2. The right ventricle has normal size and  normal systolic function.  3. Moderately dilated left atrial size.  4. Normal right atrial size.  5. Mitral valve regurgitation is mild by color flow Doppler.  6. The mitral valve normal in structure and function.  7. There is mildly calcified of the mitral valve.  8. Normal tricuspid valve.  9. Tricuspid regurgitation is mild. 10. Aortic valve normal. 11. No atrial level shunt detected by color flow Doppler.  EKG:  EKG is ordered today.  The ekg ordered today demonstrates sinus rhythm was short PR interval,  Q waves in lead III however not in adjacent leads.  Mild T wave inversion in lead V4 through V6.  Recent Labs: 05/09/2018: BUN 9; Creatinine, Ser 0.67; NT-Pro BNP 180; Potassium 4.8; Sodium 140  Recent Lipid Panel No results found for: CHOL, TRIG, HDL, CHOLHDL, VLDL, LDLCALC, LDLDIRECT  Physical Exam:    VS:  Temp 97.9 F (36.6 C)   Ht 5\' 2"  (1.575 m)   Wt 228 lb 6.4 oz (103.6 kg)   SpO2 97%   BMI 41.77 kg/m     Wt Readings from Last 3 Encounters:  05/07/19 228 lb 6.4 oz (103.6 kg)  12/09/18 223 lb (101.2 kg)  06/17/18 226 lb (102.5 kg)     GEN:  Well nourished, well developed in no acute distress HEENT: Normal NECK: No JVD; No carotid bruits LYMPHATICS: No lymphadenopathy CARDIAC: RRR, no murmurs, rubs, gallops RESPIRATORY:  Clear to auscultation without rales, wheezing or rhonchi  ABDOMEN: Soft, non-tender, non-distended MUSCULOSKELETAL:  No edema; No deformity  SKIN: Warm and dry NEUROLOGIC:  Alert and oriented x 3 PSYCHIATRIC:  Normal affect   ASSESSMENT:    1. Chest pain of uncertain etiology   2. Essential hypertension   3. Controlled type 2 diabetes mellitus without complication, without long-term current use of insulin (HCC)   4. Hypothyroidism, unspecified type   5. WPW (Wolff-Parkinson-White syndrome)   6. DOE (dyspnea on exertion)    PLAN:    In order of problems listed above:  1. Chest pain: Symptom is concerning, she is actively having chest  pain in the clinic.  Her symptom has been intermittent for the past 2 days.  Although there is a sore spot on the right neck that is worse with palpation, her chest pain is not affected by palpation, deep inspiration or body rotation.  Chest pain also radiating down the right arm as well.  I discussed the case with Dr. 06/19/18 who has also seen the patient in the clinic.  We will obtain chest x-ray x2 views.  We will also obtain stat troponin in the clinic, if troponin comes back positive tonight, patient will be instructed to go to the ED.  If troponin come back negative, then we will proceed with urgent Myoview  2. Dyspnea: She has been having dyspnea on exertion for over a year now, symptom has been worsening in the past 2 days.  On physical exam, I did not hear any crackle, rhonchi or wheezing in her lung.  We recommend a chest x-ray x2 views.  She has kept herself isolated in the past few weeks and it does not have clear presentation consistent with COVID-19 infection.  I want to make sure her worsening dyspnea on exertion is not related to underlying coronary artery disease  -We checked her ambulatory O2, despite her being severely short of breath after walking short distance, O2 saturation remained 95 to 97% after walking 1 lap.  She was too fatigued to walk a second lap.  3. Hypertension: Blood pressure stable on current therapy  4. DM2: Managed by primary care provider  5. Hypothyroidism: Continue Synthroid  6. Wolff-Parkinson-White syndrome: Status post ablation in 1997.   Medication Adjustments/Labs and Tests Ordered: Current medicines are reviewed at length with the patient today.  Concerns regarding medicines are outlined above.  Orders Placed This Encounter  Procedures  . DG Chest 2 View  . Troponin I  . MYOCARDIAL PERFUSION IMAGING  . EKG 12-Lead   No orders of the defined types were  placed in this encounter.   Patient Instructions  Medication Instructions:  Your physician  recommends that you continue on your current medications as directed. Please refer to the Current Medication list given to you today.  *If you need a refill on your cardiac medications before your next appointment, please call your pharmacy*  Lab Work: Your physician recommends that you return for lab work TODAY:  STAT TROPONIN  If you have labs (blood work) drawn today and your tests are completely normal, you will receive your results only by: Marland Kitchen MyChart Message (if you have MyChart) OR . A paper copy in the mail If you have any lab test that is abnormal or we need to change your treatment, we will call you to review the results.  Testing/Procedures: Your physician has requested that you have a lexiscan myoview. For further information please visit [email or web address] Please follow instruction sheet, as given.   A chest x-ray takes a picture of the organs and structures inside the chest, including the heart, lungs, and blood vessels. This test can show several things, including, whether the heart is enlarges; whether fluid is building up in the lungs; and whether pacemaker / defibrillator leads are still in place. [facility] [address]  Follow-Up: At [facility], you and your health needs are our priority.  As part of our continuing mission to provide you with exceptional heart care, we have created designated Provider Care Teams.  These Care Teams include your primary Cardiologist (physician) and Advanced Practice Providers (APPs -  Physician Assistants and Nurse Practitioners) who all work together to provide you with the care you need, when you need it.  Your next appointment:   2 week(s)  The format for your next appointment:   In Person  Provider:   K. Italy Hilty, MD  Other Instructions      Signed, Azalee Course, PA  05/07/2019 2:17 PM    [facility]

## 2019-05-08 ENCOUNTER — Other Ambulatory Visit: Payer: Self-pay

## 2019-05-08 ENCOUNTER — Emergency Department (HOSPITAL_COMMUNITY): Payer: Medicare Other

## 2019-05-08 ENCOUNTER — Encounter (HOSPITAL_COMMUNITY): Payer: Self-pay | Admitting: Emergency Medicine

## 2019-05-08 ENCOUNTER — Emergency Department (HOSPITAL_COMMUNITY)
Admission: EM | Admit: 2019-05-08 | Discharge: 2019-05-08 | Disposition: A | Discharge status: Home / Self Care | Payer: Medicare Other | Attending: Emergency Medicine | Admitting: Emergency Medicine

## 2019-05-08 DIAGNOSIS — R0602 Shortness of breath: Secondary | ICD-10-CM | POA: Insufficient documentation

## 2019-05-08 DIAGNOSIS — Z79899 Other long term (current) drug therapy: Secondary | ICD-10-CM | POA: Diagnosis not present

## 2019-05-08 DIAGNOSIS — I10 Essential (primary) hypertension: Secondary | ICD-10-CM | POA: Diagnosis not present

## 2019-05-08 DIAGNOSIS — Z20822 Contact with and (suspected) exposure to covid-19: Secondary | ICD-10-CM | POA: Insufficient documentation

## 2019-05-08 DIAGNOSIS — Z7982 Long term (current) use of aspirin: Secondary | ICD-10-CM | POA: Diagnosis not present

## 2019-05-08 DIAGNOSIS — E039 Hypothyroidism, unspecified: Secondary | ICD-10-CM | POA: Insufficient documentation

## 2019-05-08 DIAGNOSIS — Z7984 Long term (current) use of oral hypoglycemic drugs: Secondary | ICD-10-CM | POA: Diagnosis not present

## 2019-05-08 DIAGNOSIS — R197 Diarrhea, unspecified: Secondary | ICD-10-CM | POA: Insufficient documentation

## 2019-05-08 DIAGNOSIS — I2510 Atherosclerotic heart disease of native coronary artery without angina pectoris: Secondary | ICD-10-CM | POA: Insufficient documentation

## 2019-05-08 DIAGNOSIS — E119 Type 2 diabetes mellitus without complications: Secondary | ICD-10-CM | POA: Insufficient documentation

## 2019-05-08 DIAGNOSIS — R0789 Other chest pain: Secondary | ICD-10-CM | POA: Diagnosis not present

## 2019-05-08 LAB — URINALYSIS, ROUTINE W REFLEX MICROSCOPIC
Bilirubin Urine: NEGATIVE
Glucose, UA: NEGATIVE mg/dL
Ketones, ur: NEGATIVE mg/dL
Leukocytes,Ua: NEGATIVE
Nitrite: NEGATIVE
Protein, ur: NEGATIVE mg/dL
Specific Gravity, Urine: 1.002 — ABNORMAL LOW (ref 1.005–1.030)
pH: 7 (ref 5.0–8.0)

## 2019-05-08 LAB — COMPREHENSIVE METABOLIC PANEL
ALT: 10 U/L (ref 0–44)
AST: 13 U/L — ABNORMAL LOW (ref 15–41)
Albumin: 4.3 g/dL (ref 3.5–5.0)
Alkaline Phosphatase: 63 U/L (ref 38–126)
Anion gap: 9 (ref 5–15)
BUN: 11 mg/dL (ref 8–23)
CO2: 29 mmol/L (ref 22–32)
Calcium: 9.6 mg/dL (ref 8.9–10.3)
Chloride: 100 mmol/L (ref 98–111)
Creatinine, Ser: 0.67 mg/dL (ref 0.44–1.00)
GFR calc Af Amer: >60 mL/min (ref >60)
GFR calc non Af Amer: >60 mL/min (ref >60)
Glucose, Bld: 123 mg/dL — ABNORMAL HIGH (ref 70–99)
Potassium: 3.8 mmol/L (ref 3.5–5.1)
Sodium: 138 mmol/L (ref 135–145)
Total Bilirubin: 1.3 mg/dL — ABNORMAL HIGH (ref 0.3–1.2)
Total Protein: 8 g/dL (ref 6.5–8.1)

## 2019-05-08 LAB — CBC
HCT: 39.2 % (ref 36.0–46.0)
Hemoglobin: 12.1 g/dL (ref 12.0–15.0)
MCH: 30.5 pg (ref 26.0–34.0)
MCHC: 30.9 g/dL (ref 30.0–36.0)
MCV: 98.7 fL (ref 80.0–100.0)
Platelets: 236 10*3/uL (ref 150–400)
RBC: 3.97 MIL/uL (ref 3.87–5.11)
RDW: 13.6 % (ref 11.5–15.5)
WBC: 4.7 10*3/uL (ref 4.0–10.5)
nRBC: 0 % (ref 0.0–0.2)

## 2019-05-08 LAB — LIPASE, BLOOD: Lipase: 22 U/L (ref 11–51)

## 2019-05-08 LAB — TROPONIN I (HIGH SENSITIVITY): Troponin I (High Sensitivity): 2 ng/L (ref <18)

## 2019-05-08 MED ORDER — SODIUM CHLORIDE 0.9% FLUSH: 3.0000 mL | Freq: Once | INTRAVENOUS | Status: DC

## 2019-05-08 NOTE — ED Notes (Signed)
Pt stated to writer when obtaining labs, "I didn't think you had to go through all of this just for a COVID test?".

## 2019-05-08 NOTE — ED Triage Notes (Signed)
Pt reports had little diarrhea this morning. Reports that her kids made her come to ED for SOB, but patient states that her normal and saw her cardiologist yesterday.

## 2019-05-08 NOTE — ED Notes (Signed)
Pt. Documented in error see above note in chart. 

## 2019-05-08 NOTE — Discharge Instructions (Signed)
You were seen in the emergency department for evaluation of chest pain shortness of breath and diarrhea.  Your lab work did not show any serious findings.  The Covid test was sent which usually takes 1 to 2 days to result.  You should isolate until your Covid testing is resulted.  Please follow-up with your primary care doctor and your cardiologist.  Return to the emergency department for any worsening shortness of breath or concerning symptoms.

## 2019-05-08 NOTE — ED Provider Notes (Signed)
[facility]-EMERGENCY DEPT Provider Note   CSN: [id number] Arrival date & time: 05/08/19  1815     History Chief Complaint  Patient presents with  . Diarrhea  . Chest Pain    Judy Wells is a 85 y.o. female.  She has a history of diabetes hypertension WPW.  No known coronary disease.  She said she has been short of breath for over a year and has had some intermittent chest pain.  She saw cardiology yesterday and is set up for outpatient testing.  3 episodes of diarrhea today and family made her come here to get a Covid test.  Shortness of breath is at baseline.  Cough productive of some white sputum intermittently.  No known fevers.  Has body aches but she says this is due to her arthritis and no recent change.  No sick contacts recent travel.  The history is provided by the patient.  Diarrhea Quality:  Watery Severity:  Moderate Onset quality:  Sudden Number of episodes:  3 Duration:  18 hours Timing:  Sporadic Progression:  Improving Relieved by:  Nothing Worsened by:  Nothing Ineffective treatments:  None tried Associated symptoms: arthralgias (chronic) and cough   Associated symptoms: no abdominal pain, no chills, no diaphoresis, no fever, no headaches and no vomiting   Risk factors: no recent antibiotic use, no sick contacts, no suspicious food intake and no travel to endemic areas   Chest Pain Pain location:  L chest Pain quality: tightness   Pain radiates to:  Does not radiate Pain severity:  Mild Onset quality:  Unable to specify Timing:  Intermittent Progression:  Unchanged Associated symptoms: cough and shortness of breath   Associated symptoms: no abdominal pain, no altered mental status, no diaphoresis, no fever, no headache, no nausea and no vomiting        Past Medical History:  Diagnosis Date  . Anemia   . Coronary artery disease 1980's  . Diabetes mellitus type II   . Diverticulosis 2011  . GERD (gastroesophageal reflux  disease)   . H. pylori infection 11/2011  . Hiatal hernia   . HTN (hypertension)   . Hypothyroid   . Myocardial infarction (HCC)    secondary to WPW  . Osteoarthritis   . Osteopenia   . Vitamin D deficiency   . Wolf-Parkinson-White syndrome 1999   s/p ablation in 1997 (Dr. Odessa Fleming) - after cardiac arrest while in [place]    Patient Active Problem List   Diagnosis Date Noted  . Weight gain 11/12/2017  . Right foot pain 10/16/2016  . Tremor 08/22/2015  . DM2 (diabetes mellitus, type 2) (HCC) 08/17/2014  . Hypothyroidism 08/17/2014  . Throat discomfort 11/16/2013  . WPW (Wolff-Parkinson-White syndrome) 07/28/2013  . DOE (dyspnea on exertion) 07/28/2013  . GERD (gastroesophageal reflux disease) 07/28/2013  . Anemia in chronic illness     Past Surgical History:  Procedure Laterality Date  . ABDOMINAL ADHESION SURGERY    . ABLATION  05/19/1997   Dr. Graciela Husbands - bidirectional conduction of right posterolateral AT - RF ablation  . APPENDECTOMY    . BREAST SURGERY     breast lump removed - tiny  . CARDIAC CATHETERIZATION  02/19/1997   noraml L main, LAD free of disease, diagonal with no obstructions, L Cfx free of disease (along with OM & PDA being normal), RCA small & non-dominant & free of disease (Dr. Langston Reusing)  . CATARACT EXTRACTION    . COLONOSCOPY    .  NM MYOCAR PERF WALL MOTION  01/2012   lexiscan myoview - normal pattern of perfusion in all regions, EF 64%     OB History    Gravida  5   Para  4   Term      Preterm      AB  1   Living  4     SAB  1   TAB      Ectopic      Multiple      Live Births              Family History  Problem Relation Age of Onset  . Dementia Mother   . Stroke Father   . Stroke Maternal Grandmother   . Aneurysm Sister   . Heart disease Cousin   . Stroke Sister     Social History   Tobacco Use  . Smoking status: Never Smoker  . Smokeless tobacco: Never Used  Substance Use Topics  . Alcohol use: Yes     Alcohol/week: 0.0 standard drinks    Comment: Rare  . Drug use: No    Home Medications Prior to Admission medications   Medication Sig Start Date End Date Taking? Authorizing Provider  acetaminophen (TYLENOL) 650 MG CR tablet Take 650 mg by mouth every 8 (eight) hours as needed for pain.    [provider]  Apoaequorin (PREVAGEN) 10 MG CAPS Take by mouth.    [provider]  aspirin 81 MG tablet Take 81 mg by mouth daily.     [provider]  Calcium Carbonate-Vitamin D 600-400 MG-UNIT per tablet Take 1 tablet by mouth.    [provider]  FLAXSEED, LINSEED, PO Take by mouth.    [provider]  gabapentin (NEURONTIN) 100 MG capsule Take 100 mg by mouth 2 (two) times daily.    [provider]  glipiZIDE (GLUCOTROL) 10 MG tablet Take 10 mg by mouth daily.    [provider]  IRON PO Take by mouth. Slow release    [provider]  levothyroxine (SYNTHROID, LEVOTHROID) 112 MCG tablet Take 100 mcg by mouth daily before breakfast.     [provider]  metFORMIN (GLUCOPHAGE) 1000 MG tablet Take 1,000 mg by mouth 2 (two) times daily with a meal.     [provider]  OVER THE COUNTER MEDICATION Patient takes a pill ordered by neurologist for left arm tremor    [provider]  propranolol ER (INDERAL LA) 60 MG 24 hr capsule TAKE 1 CAPSULE BY MOUTH EVERY DAY 01/13/18   Hilty, Lisette Abu, MD    Allergies    Atorvastatin and Pravachol [pravastatin sodium]  Review of Systems   Review of Systems  Constitutional: Negative for chills, diaphoresis and fever.  HENT: Negative for sore throat.   Eyes: Negative for visual disturbance.  Respiratory: Positive for cough and shortness of breath.   Cardiovascular: Positive for chest pain.  Gastrointestinal: Positive for diarrhea. Negative for abdominal pain, nausea and vomiting.  Genitourinary: Negative for dysuria.  Musculoskeletal: Positive for arthralgias  (chronic).  Skin: Negative for rash.  Neurological: Negative for headaches.    Physical Exam Updated Vital Signs BP (!) 154/68   Pulse 68   Temp 98.7 F (37.1 C) (Oral)   Resp 19   SpO2 98%   Physical Exam Vitals and nursing note reviewed.  Constitutional:      General: She is not in acute distress.    Appearance: She is well-developed.  HENT:     Head: Normocephalic and atraumatic.  Eyes:     Conjunctiva/sclera: Conjunctivae normal.  Cardiovascular:     Rate and Rhythm: Normal rate and regular rhythm.     Heart sounds: Normal heart sounds. No murmur.  Pulmonary:     Effort: Pulmonary effort is normal. No respiratory distress.     Breath sounds: Normal breath sounds.  Abdominal:     Palpations: Abdomen is soft. There is no mass.     Tenderness: There is no abdominal tenderness.  Musculoskeletal:        General: Normal range of motion.     Cervical back: Neck supple.     Right lower leg: Edema present.     Left lower leg: Edema present.  Skin:    General: Skin is warm and dry.     Capillary Refill: Capillary refill takes less than 2 seconds.  Neurological:     General: No focal deficit present.     Mental Status: She is alert.     ED Results / Procedures / Treatments   Labs (all labs ordered are listed, but only abnormal results are displayed) Labs Reviewed  COMPREHENSIVE METABOLIC PANEL - Abnormal; Notable for the following components:      Result Value   Glucose, Bld 123 (*)    AST 13 (*)    Total Bilirubin 1.3 (*)    All other components within normal limits  URINALYSIS, ROUTINE W REFLEX MICROSCOPIC - Abnormal; Notable for the following components:   Color, Urine STRAW (*)    Specific Gravity, Urine 1.002 (*)    Hgb urine dipstick SMALL (*)    Bacteria, UA MANY (*)    All other components within normal limits  NOVEL CORONAVIRUS, NAA (HOSP ORDER, SEND-OUT TO REF LAB; TAT 18-24 HRS)  LIPASE, BLOOD  CBC  TROPONIN I (HIGH SENSITIVITY)  TROPONIN I (HIGH  SENSITIVITY)    EKG EKG Interpretation  Date/Time:  Friday May 08 2019 18:40:06 EST Ventricular Rate:  72 PR Interval:  134 QRS Duration: 100 QT Interval:  394 QTC Calculation: 431 R Axis:   1 Text Interpretation: Unusual P axis and short PR, probable junctional rhythm with Premature supraventricular complexes Minimal voltage criteria for LVH, may be normal variant Nonspecific T wave abnormality Abnormal ECG No significant change since 5/03 Confirmed by Aletta Edouard [phone]) on 05/08/2019 9:11:20 PM   Radiology DG Chest 2 View  Result Date: 05/07/2019 CLINICAL DATA:  Chest pain EXAM: CHEST - 2 VIEW COMPARISON:  04/22/2009 FINDINGS: Heart and mediastinal contours are within normal limits. No focal opacities or effusions. No acute bony abnormality. IMPRESSION: No active cardiopulmonary disease. Electronically Signed   By: Rolm Baptise M.D.   On: 05/07/2019 15:28    Procedures Procedures (including critical care time)  Medications Ordered in ED Medications - No data to display  ED Course  I have reviewed the triage vital signs and the nursing notes.  Pertinent labs & imaging results that were available during my care of the patient were reviewed by me and considered in my medical decision making (see chart for details).  Clinical Course as of May 08 1022  Fri May 07, 5469  669 85 year old female with longstanding shortness of breath and more recently atypical chest pain.  She saw cardiology yesterday and had a negative troponin.  Due to diarrhea today her family was consideration [facility] Covid sending her here for evaluation.  She is satting 98 to 99% on room air.  High-sensitivity  troponin is 2.   [MB]  2109 Chest x-ray was done but did not cross into epic.  It was no acute infiltrates.   [MB]  2152 EKG showing unusual P axis short PR, rate of 72, nonspecific T waves.   [MB]    Clinical Course User Index [MB] Terrilee Files, MD   MDM Rules/Calculators/A&P                      Nanda Quinton was evaluated in Emergency Department on 05/08/2019 for the symptoms described in the history of present illness. She was evaluated in the context of the global COVID-19 pandemic, which necessitated consideration that the patient might be at risk for infection with the SARS-CoV-2 virus that causes COVID-19. Institutional protocols and algorithms that pertain to the evaluation of patients at risk for COVID-19 are in a state of rapid change based on information released by regulatory bodies including the CDC and federal and state organizations. These policies and algorithms were followed during the patient's care in the ED.   Final Clinical Impression(s) / ED Diagnoses Final diagnoses:  Atypical chest pain  SOB (shortness of breath)  Diarrhea, unspecified type  Person under investigation for COVID-19    Rx / DC Orders ED Discharge Orders    None       Terrilee Files, MD 05/09/19 1025

## 2019-05-10 LAB — NOVEL CORONAVIRUS, NAA (HOSP ORDER, SEND-OUT TO REF LAB; TAT 18-24 HRS): SARS-CoV-2, NAA: NOT DETECTED

## 2019-05-11 ENCOUNTER — Ambulatory Visit: Payer: Medicare Other | Admitting: Physician Assistant

## 2019-05-12 ENCOUNTER — Encounter: Payer: Self-pay | Admitting: *Deleted

## 2019-05-13 ENCOUNTER — Other Ambulatory Visit: Payer: Self-pay

## 2019-05-14 ENCOUNTER — Encounter: Payer: Self-pay | Admitting: Obstetrics and Gynecology

## 2019-05-14 ENCOUNTER — Telehealth (HOSPITAL_COMMUNITY): Payer: Self-pay

## 2019-05-14 ENCOUNTER — Ambulatory Visit: Payer: Medicare Other | Admitting: Obstetrics and Gynecology

## 2019-05-14 VITALS — BP 124/84 | Ht 61.0 in | Wt 227.0 lb

## 2019-05-14 DIAGNOSIS — Z01419 Encounter for gynecological examination (general) (routine) without abnormal findings: Secondary | ICD-10-CM | POA: Diagnosis not present

## 2019-05-14 MED ORDER — NYSTATIN 100000 UNIT/GM EX POWD: 1.0000 "application " | Freq: Three times a day (TID) | CUTANEOUS | 0 refills | Status: AC

## 2019-05-14 NOTE — Progress Notes (Signed)
Judy Wells [birth year] [id number]  SUBJECTIVE:  85 y.o. [id number] female for annual routine gynecologic exam.   Husband has had dementia in the last 3 years. She notes an itchy in left groin area and using a lotion with some relief.  No gynecologic concerns.  She says she has not had a DEXA scan in a few years.  Right knee is hurting from arthritis.  Thinks she had a mammogram last year.     Current Outpatient Medications  Medication Sig Dispense Refill  . acetaminophen (TYLENOL) 650 MG CR tablet Take 650 mg by mouth every 8 (eight) hours as needed for pain.    Marland Kitchen aspirin 81 MG tablet Take 81 mg by mouth daily.     . Calcium Carbonate-Vitamin D 600-400 MG-UNIT per tablet Take 1 tablet by mouth.    Marland Kitchen FLAXSEED, LINSEED, PO Take by mouth.    . gabapentin (NEURONTIN) 100 MG capsule Take 100 mg by mouth 2 (two) times daily.    Marland Kitchen glipiZIDE (GLUCOTROL) 10 MG tablet Take 10 mg by mouth daily.    . IRON PO Take by mouth. Slow release    . levothyroxine (SYNTHROID, LEVOTHROID) 112 MCG tablet Take 100 mcg by mouth daily before breakfast.     . metFORMIN (GLUCOPHAGE) 1000 MG tablet Take 1,000 mg by mouth 2 (two) times daily with a meal.     . propranolol ER (INDERAL LA) 60 MG 24 hr capsule TAKE 1 CAPSULE BY MOUTH EVERY DAY 30 capsule 11   No current facility-administered medications for this visit.   Allergies: Atorvastatin and Pravachol [pravastatin sodium]  No LMP recorded. Patient is postmenopausal.  Past medical history,surgical history, problem list, medications, allergies, family history and social history were all reviewed and documented as reviewed in the EPIC chart.  ROS:  Feeling well. No dyspnea or chest pain on exertion.  No abdominal pain, change in bowel habits, black or bloody stools.  No urinary tract symptoms. GYN ROS: no abnormal bleeding, pelvic pain or discharge, no breast pain or new or enlarging lumps on self exam. No neurological complaints.    OBJECTIVE:  The patient  appears well, alert, oriented x 3, in no distress. BP 124/84   Ht 5\' 1"  (1.549 m)   Wt 227 lb (103 kg)   BMI 42.89 kg/m  ENT normal.  Neck supple. No adenopathy or thyromegaly. PERLA. Lungs are clear, good air entry, no wheezes, rhonchi or rales. S1 and S2 normal, no murmurs, regular rate and rhythm. Abdomen soft without tenderness, guarding, mass or organomegaly. + umbilical hernia. Neurological is normal, no focal findings.  BREAST EXAM: right breast normal without mass, skin or nipple changes or axillary nodes, +fullness in left inner inferior quadrant but no skin or nipple changes or axillary nodes  PELVIC EXAM: VULVA: normal appearing vulva with no masses, tenderness or lesions, +atrophy changes, VAGINA: normal appearing vagina with normal color and discharge, no lesions, CERVIX: normal appearing cervix without discharge or lesions, UTERUS: uterus is normal size, shape, consistency and nontender, ADNEXA: normal adnexa in size, nontender and no masses, RECTAL: normal rectal, no masses.  Mild erythematous coloration in left groin with out obvious discharge, odor, or disruption in skin.  Chaperone: present during the examination  ASSESSMENT:  85 yo 97 here for annual gynecologic exam  PLAN:  1. Postmenopausal.  No concerns with vaginal symptoms or bleeding.  No vasomotor symptom concerns. 2. Pap smear surveillance concluded based on guidelines due to patient's age, and she  is in agreement with this plan.  Normal pelvic exam today. 3. Mammogram.  Last appears to be 2016 according to our records.  Recommended continuing with annual mammography. Some fullness in left breast exam normal today.  She will discuss with her primary care provider. 4. Colonoscopy 2011.  She will discuss whether to continue with screening with her primary care provider. 5. Osteoporosis.  Last known DEXA 2017.  I recommend she discuss this with her primary care provider for appropriate follow up and  continuation of treatment. 6. Asymptomatic umbilical hernia on exam. 7. Left groin rash.  Possible yeast.  Rx for Nystatin powder TID x 1 week is provided. 8. Health maintenance. Routine lab work is with her primary care provider's office.  Return annually or prn.  Larey Days MD  05/14/19

## 2019-05-14 NOTE — Patient Instructions (Signed)
It was nice to meet you today! Please check with your primary doctor to make sure your mammogram, colonoscopy, and bone mineral density (DEXA) scans are up to date. I sent you a prescription for nystatin anti-fungal powder to use on your left groin area 3 times per day for 1 week.  If it does not improve, please let us know.

## 2019-05-14 NOTE — Telephone Encounter (Signed)
Encounter complete. 

## 2019-05-15 ENCOUNTER — Telehealth (HOSPITAL_COMMUNITY): Payer: Self-pay

## 2019-05-15 NOTE — Telephone Encounter (Signed)
Encounter complete. 

## 2019-05-19 ENCOUNTER — Ambulatory Visit (HOSPITAL_COMMUNITY)
Admission: RE | Admit: 2019-05-19 | Discharge: 2019-05-19 | Disposition: A | Discharge status: Home / Self Care | Payer: Medicare Other | Source: Ambulatory Visit | Attending: Cardiovascular Disease | Admitting: Cardiovascular Disease

## 2019-05-19 ENCOUNTER — Other Ambulatory Visit: Payer: Self-pay

## 2019-05-19 DIAGNOSIS — R079 Chest pain, unspecified: Secondary | ICD-10-CM | POA: Diagnosis present

## 2019-05-19 MED ORDER — TECHNETIUM TC 99M TETROFOSMIN IV KIT
29.2000 | PACK | Freq: Once | INTRAVENOUS | Status: AC | PRN
  Administered 2019-05-19: 29.2 via INTRAVENOUS
  Filled 2019-05-19: qty 30

## 2019-05-19 MED ORDER — REGADENOSON 0.4 MG/5ML IV SOLN
0.4000 mg | Freq: Once | INTRAVENOUS | Status: AC
  Administered 2019-05-19: 0.4 mg via INTRAVENOUS

## 2019-05-20 ENCOUNTER — Ambulatory Visit (HOSPITAL_COMMUNITY)
Admission: RE | Admit: 2019-05-20 | Discharge: 2019-05-20 | Disposition: A | Discharge status: Home / Self Care | Payer: Medicare Other | Source: Ambulatory Visit | Attending: Cardiology | Admitting: Cardiology

## 2019-05-20 LAB — MYOCARDIAL PERFUSION IMAGING
LV dias vol: 93 mL (ref 46–106)
LV sys vol: 32 mL
Peak HR: 85 {beats}/min
Rest HR: 64 {beats}/min
SDS: 2
SRS: 2
SSS: 4
TID: 0.87

## 2019-05-20 MED ORDER — TECHNETIUM TC 99M TETROFOSMIN IV KIT
29.9000 | PACK | Freq: Once | INTRAVENOUS | Status: AC | PRN
  Administered 2019-05-20: 29.9 via INTRAVENOUS

## 2019-05-21 ENCOUNTER — Ambulatory Visit (HOSPITAL_COMMUNITY): Payer: Medicare Other

## 2019-05-29 ENCOUNTER — Ambulatory Visit: Payer: Medicare Other | Admitting: Internal Medicine

## 2019-05-29 ENCOUNTER — Encounter: Payer: Self-pay | Admitting: Internal Medicine

## 2019-05-29 ENCOUNTER — Other Ambulatory Visit: Payer: Self-pay

## 2019-05-29 VITALS — BP 125/63 | HR 65 | Ht 62.0 in | Wt 227.2 lb

## 2019-05-29 DIAGNOSIS — R079 Chest pain, unspecified: Secondary | ICD-10-CM | POA: Diagnosis not present

## 2019-05-29 DIAGNOSIS — I10 Essential (primary) hypertension: Secondary | ICD-10-CM | POA: Diagnosis not present

## 2019-05-29 DIAGNOSIS — R0602 Shortness of breath: Secondary | ICD-10-CM | POA: Diagnosis not present

## 2019-05-29 NOTE — Patient Instructions (Addendum)
Medication Instructions:  Your physician recommends that you continue on your current medications as directed. Please refer to the Current Medication list given to you today.  *If you need a refill on your cardiac medications before your next appointment, please call your pharmacy*   Follow-Up: At [facility], you and your health needs are our priority.  As part of our continuing mission to provide you with exceptional heart care, we have created designated Provider Care Teams.  These Care Teams include your primary Cardiologist (physician) and Advanced Practice Providers (APPs -  Physician Assistants and Nurse Practitioners) who all work together to provide you with the care you need, when you need it.  Your next appointment:   6 month(s)  The format for your next appointment:   In Person  Provider:   You may see Chrystie Nose, MD or one of the following Advanced Practice Providers on your designated Care Team:    Azalee Course, PA-C  Micah Flesher, [place] or   Judy Pimple, [place]   Other Instructions  We have cancelled your March 3rd appointment

## 2019-05-31 ENCOUNTER — Encounter: Payer: Self-pay | Admitting: Internal Medicine
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
## 2019-06-24 ENCOUNTER — Ambulatory Visit: Payer: Medicare Other | Admitting: Internal Medicine

## 2019-07-10 ENCOUNTER — Ambulatory Visit: Payer: Medicare Other | Attending: Internal Medicine

## 2019-07-10 DIAGNOSIS — Z23 Encounter for immunization: Secondary | ICD-10-CM

## 2019-07-10 NOTE — Progress Notes (Signed)
   Covid-19 Vaccination Clinic  Name:  Judy Wells    MRN: [id number] DOB: 1935/04/06  07/10/2019  Ms. Judy Wells was observed post Covid-19 immunization for 15 minutes without incident. She was provided with Vaccine Information Sheet and instruction to access the V-Safe system.   [name] was instructed to call 911 with any severe reactions post vaccine: Marland Kitchen Difficulty breathing  . Swelling of face and throat  . A fast heartbeat  . A bad rash all over body  . Dizziness and weakness   Immunizations Administered    Name Date Dose VIS Date Route   Pfizer COVID-19 Vaccine 07/10/2019 12:43 PM 0.3 mL 04/03/2019 Intramuscular   Manufacturer: [employer], [employer]   Lot: [id number]   NDC: [address]-6

## 2019-07-31 ENCOUNTER — Ambulatory Visit: Payer: Medicare Other | Attending: Internal Medicine

## 2019-07-31 DIAGNOSIS — Z23 Encounter for immunization: Secondary | ICD-10-CM

## 2019-07-31 NOTE — Progress Notes (Signed)
   Covid-19 Vaccination Clinic  Name:  Judy Wells    MRN: [id number] DOB: 1935/04/14  07/31/2019  [name] was observed post Covid-19 immunization for 15 minutes without incident. She was provided with Vaccine Information Sheet and instruction to access the V-Safe system.   [name] was instructed to call 911 with any severe reactions post vaccine: Marland Kitchen Difficulty breathing  . Swelling of face and throat  . A fast heartbeat  . A bad rash all over body  . Dizziness and weakness   Immunizations Administered    Name Date Dose VIS Date Route   Pfizer COVID-19 Vaccine 07/31/2019 12:23 PM 0.3 mL 04/03/2019 Intramuscular   Manufacturer: [employer], [employer]   Lot: [id number]   NDC: [address]-7

## 2019-10-08 ENCOUNTER — Other Ambulatory Visit: Payer: Self-pay

## 2019-10-08 ENCOUNTER — Encounter (HOSPITAL_COMMUNITY): Payer: Self-pay

## 2019-10-08 ENCOUNTER — Ambulatory Visit (INDEPENDENT_AMBULATORY_CARE_PROVIDER_SITE_OTHER): Payer: Medicare Other

## 2019-10-08 ENCOUNTER — Ambulatory Visit (HOSPITAL_COMMUNITY)
Admission: EM | Admit: 2019-10-08 | Discharge: 2019-10-08 | Disposition: A | Discharge status: Home / Self Care | Payer: Medicare Other | Attending: Family Medicine | Admitting: Family Medicine

## 2019-10-08 DIAGNOSIS — R05 Cough: Secondary | ICD-10-CM | POA: Diagnosis not present

## 2019-10-08 DIAGNOSIS — R079 Chest pain, unspecified: Secondary | ICD-10-CM | POA: Diagnosis not present

## 2019-10-08 DIAGNOSIS — B349 Viral infection, unspecified: Secondary | ICD-10-CM

## 2019-10-08 DIAGNOSIS — R0602 Shortness of breath: Secondary | ICD-10-CM

## 2019-10-08 DIAGNOSIS — R062 Wheezing: Secondary | ICD-10-CM

## 2019-10-08 NOTE — Discharge Instructions (Addendum)
Your x-ray did not show any infection today. This is most likely some sort of virus You can try some guaifenesin for cough.  If your symptoms worsen please go to the ER.  Otherwise you can follow-up with your doctor

## 2019-10-08 NOTE — ED Provider Notes (Signed)
[place]    CSN: [id number] Arrival date & time: 10/08/19  1124      History   Chief Complaint Chief Complaint  Patient presents with  . sore throat, loss of voice, chest congestion    HPI Judy Wells is a 85 y.o. female.   Pt is an 85 year old African American female with history CAD, HTN, DM2, MI. Presents with voice hoarseness, chest tightness, pain in right side of neck, productive cough with white sputum. Received both covid vaccines. Denies being around anyone who is sick. Symptoms minimally relieved by hot water with lemon. Symptoms aggravated at night and with coughing.  Fevers, chills, body aches or night sweats.  ROS per HPI      Past Medical History:  Diagnosis Date  . Anemia   . Coronary artery disease 1980's  . Diabetes mellitus type II   . Diverticulosis 2011  . GERD (gastroesophageal reflux disease)   . H. pylori infection 11/2011  . Hiatal hernia   . HTN (hypertension)   . Hypothyroid   . Myocardial infarction ([facility])    secondary to WPW  . Osteoarthritis   . Osteopenia   . Vitamin D deficiency   . Wolf-Parkinson-White syndrome 1999   s/p ablation in 1997 (Dr. Olin Pia) - after cardiac arrest while in [place]    Patient Active Problem List   Diagnosis Date Noted  . Weight gain 11/12/2017  . Right foot pain 10/16/2016  . Tremor 08/22/2015  . DM2 (diabetes mellitus, type 2) ([facility]) 08/17/2014  . Hypothyroidism 08/17/2014  . Throat discomfort 11/16/2013  . WPW (Wolff-Parkinson-White syndrome) 07/28/2013  . DOE (dyspnea on exertion) 07/28/2013  . GERD (gastroesophageal reflux disease) 07/28/2013  . Anemia in chronic illness     Past Surgical History:  Procedure Laterality Date  . ABDOMINAL ADHESION SURGERY    . ABLATION  05/19/1997   Dr. Caryl Comes - bidirectional conduction of right posterolateral AT - RF ablation  . APPENDECTOMY    . BREAST SURGERY     breast lump removed - tiny  . CARDIAC CATHETERIZATION  02/19/1997    noraml L main, LAD free of disease, diagonal with no obstructions, L Cfx free of disease (along with OM & PDA being normal), RCA small & non-dominant & free of disease (Dr. Rockne Menghini)  . CATARACT EXTRACTION    . COLONOSCOPY    . NM MYOCAR PERF WALL MOTION  01/2012   lexiscan myoview - normal pattern of perfusion in all regions, EF 64%    OB History    Gravida  5   Para  4   Term      Preterm      AB  1   Living  4     SAB  1   TAB      Ectopic      Multiple      Live Births               Home Medications    Prior to Admission medications   Medication Sig Start Date End Date Taking? Authorizing Provider  acetaminophen (TYLENOL) 650 MG CR tablet Take 650 mg by mouth every 8 (eight) hours as needed for pain.    [provider]  aspirin 81 MG tablet Take 81 mg by mouth daily.     [provider]  Calcium Carbonate-Vitamin D 600-400 MG-UNIT per tablet Take 1 tablet by mouth.    [provider]  FLAXSEED, LINSEED, PO Take by  mouth.    [provider]  gabapentin (NEURONTIN) 100 MG capsule Take 100 mg by mouth 2 (two) times daily.    [provider]  glipiZIDE (GLUCOTROL) 10 MG tablet Take 10 mg by mouth daily.    [provider]  IRON PO Take by mouth. Slow release    [provider]  levothyroxine (SYNTHROID, LEVOTHROID) 112 MCG tablet Take 100 mcg by mouth daily before breakfast.     [provider]  metFORMIN (GLUCOPHAGE) 1000 MG tablet Take 1,000 mg by mouth 2 (two) times daily with a meal.     [provider]  propranolol ER (INDERAL LA) 60 MG 24 hr capsule TAKE 1 CAPSULE BY MOUTH EVERY DAY 01/13/18   Hilty, Lisette Abu, MD    Family History Family History  Problem Relation Age of Onset  . Dementia Mother   . Stroke Father   . Stroke Maternal Grandmother   . Aneurysm Sister   . Heart disease Cousin   . Stroke Sister     Social History Social History   Tobacco Use  . Smoking  status: Never Smoker  . Smokeless tobacco: Never Used  Vaping Use  . Vaping Use: Never used  Substance Use Topics  . Alcohol use: Yes    Alcohol/week: 0.0 standard drinks    Comment: Rare  . Drug use: No     Allergies   Atorvastatin and Pravachol [pravastatin sodium]   Review of Systems Review of Systems  Constitutional: Negative.   HENT: Positive for ear pain, sore throat and voice change.   Eyes: Negative.   Respiratory: Positive for cough, chest tightness and shortness of breath.   Cardiovascular: Positive for chest pain.  Gastrointestinal: Negative for abdominal pain.  Genitourinary: Negative.   Allergic/Immunologic: Positive for environmental allergies.  Neurological: Negative.   Psychiatric/Behavioral: Negative.      Physical Exam Triage Vital Signs ED Triage Vitals  Enc Vitals Group     BP 10/08/19 1226 113/89     Pulse Rate 10/08/19 1226 (!) 57     Resp 10/08/19 1226 18     Temp 10/08/19 1226 98.1 F (36.7 C)     Temp Source 10/08/19 1226 Oral     SpO2 10/08/19 1226 100 %     Weight 10/08/19 1227 216 lb (98 kg)     Height 10/08/19 1227 5\' 1"  (1.549 m)     Head Circumference --      Peak Flow --      Pain Score 10/08/19 1227 6     Pain Loc --      Pain Edu? --      Excl. in GC? --    No data found.  Updated Vital Signs BP 113/89   Pulse (!) 57   Temp 98.1 F (36.7 C) (Oral)   Resp 18   Ht 5\' 1"  (1.549 m)   Wt 216 lb (98 kg)   SpO2 100%   BMI 40.81 kg/m   Visual Acuity Right Eye Distance:   Left Eye Distance:   Bilateral Distance:    Right Eye Near:   Left Eye Near:    Bilateral Near:     Physical Exam Constitutional:      Appearance: She is obese.  HENT:     Head: Normocephalic.     Right Ear: Tympanic membrane, ear canal and external ear normal.     Left Ear: Tympanic membrane, ear canal and external ear normal.     Nose:  Rhinorrhea present.     Right Turbinates: Swollen.     Left Turbinates: Swollen.     Mouth/Throat:      Lips: Pink.     Mouth: Mucous membranes are moist. No oral lesions.     Dentition: No gum lesions.     Tongue: No lesions.     Pharynx: Oropharynx is clear.     Tonsils: No tonsillar exudate.  Eyes:     General: Lids are normal.     Extraocular Movements: Extraocular movements intact.     Conjunctiva/sclera: Conjunctivae normal.     Pupils: Pupils are equal, round, and reactive to light.     Visual Fields: Right eye visual fields normal and left eye visual fields normal.  Neck:     Trachea: Trachea normal.  Cardiovascular:     Rate and Rhythm: Normal rate and regular rhythm.     Pulses: Normal pulses.     Heart sounds: Normal heart sounds.  Pulmonary:     Effort: Pulmonary effort is normal. No respiratory distress.     Breath sounds: Normal breath sounds.  Abdominal:     Palpations: Abdomen is soft.  Musculoskeletal:        General: Normal range of motion.     Cervical back: Full passive range of motion without pain and normal range of motion. No edema, erythema or signs of trauma. No muscular tenderness.  Lymphadenopathy:     Cervical: No cervical adenopathy.  Skin:    General: Skin is warm and dry.     Capillary Refill: Capillary refill takes less than 2 seconds.  Neurological:     General: No focal deficit present.     Mental Status: She is alert.     GCS: GCS eye subscore is 4. GCS verbal subscore is 5. GCS motor subscore is 6.  Psychiatric:        Mood and Affect: Mood normal.        Behavior: Behavior normal.      UC Treatments / Results  Labs (all labs ordered are listed, but only abnormal results are displayed) Labs Reviewed - No data to display  EKG   Radiology DG Chest 2 View  Result Date: 10/08/2019 CLINICAL DATA:  Shortness of breath with cough and chest tightness as well as wheezing 2 weeks. EXAM: CHEST - 2 VIEW COMPARISON:  05/06/2018 FINDINGS: Lungs are adequately inflated without lobar consolidation or effusion. Minimal hazy prominence of the  central vessels likely mild vascular congestion. Borderline cardiomegaly. Remainder of the exam is unchanged. IMPRESSION: Borderline cardiomegaly with suggestion of minimal vascular congestion. Electronically Signed   By: Elberta Fortis M.D.   On: 10/08/2019 13:52    Procedures ED EKG  Date/Time: 10/09/2019 1:51 PM Performed by: Janace Aris, NP Authorized by: Janace Aris, NP   Previous ECG:    Previous ECG:  Compared to current   Similarity:  No change   (including critical care time)  Medications Ordered in UC Medications - No data to display  Initial Impression / Assessment and Plan / UC Course  I have reviewed the triage vital signs and the nursing notes.  Pertinent labs & imaging results that were available during my care of the patient were reviewed by me and considered in my medical decision making (see chart for details).     Viral illness Most likely diagnosis is on symptoms. X-ray with mild cardiomegaly today and mild vascular congestion No signs of heart failure or pneumonia. EKG reviewed  with similar reading as previous EKG. We will have her do guaifenesin for cough Recommend if symptoms worsen to include worsening chest pain, shortness of breath she will need to go to the ER. Covid swab pending Final Clinical Impressions(s) / UC Diagnoses   Final diagnoses:  Viral illness     Discharge Instructions     Your x-ray did not show any infection today. This is most likely some sort of virus You can try some guaifenesin for cough.  If your symptoms worsen please go to the ER.  Otherwise you can follow-up with your doctor     ED Prescriptions    None     PDMP not reviewed this encounter.   Janace Aris, NP 10/09/19 1351

## 2019-10-08 NOTE — ED Triage Notes (Signed)
Pt c/o sore throat, chest tightness, loss of voice, neck pain, body aches, dyspnea w/exertionx1wk. Pt has non labored breathing.

## 2019-12-22 ENCOUNTER — Other Ambulatory Visit: Payer: Self-pay

## 2019-12-22 ENCOUNTER — Ambulatory Visit (INDEPENDENT_AMBULATORY_CARE_PROVIDER_SITE_OTHER): Payer: Medicare Other | Admitting: Internal Medicine

## 2019-12-22 ENCOUNTER — Encounter: Payer: Self-pay | Admitting: Internal Medicine

## 2019-12-22 VITALS — BP 118/60 | HR 65 | Ht 61.0 in | Wt 221.2 lb

## 2019-12-22 DIAGNOSIS — R0600 Dyspnea, unspecified: Secondary | ICD-10-CM

## 2019-12-22 DIAGNOSIS — R0609 Other forms of dyspnea: Secondary | ICD-10-CM

## 2019-12-22 DIAGNOSIS — I272 Pulmonary hypertension, unspecified: Secondary | ICD-10-CM
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
## 2019-12-22 NOTE — Patient Instructions (Signed)
Medication Instructions:  None Ordered At This Time.  *If you need a refill on your cardiac medications before your next appointment, please call your pharmacy*  Lab Work: None Ordered At This Time.  If you have labs (blood work) drawn today and your tests are completely normal, you will receive your results only by: Marland Kitchen MyChart Message (if you have MyChart) OR . A paper copy in the mail If you have any lab test that is abnormal or we need to change your treatment, we will call you to review the results.  Testing/Procedures: None Ordered At This Time.   Follow-Up: At [facility], you and your health needs are our priority.  As part of our continuing mission to provide you with exceptional heart care, we have created designated Provider Care Teams.  These Care Teams include your primary Cardiologist (physician) and Advanced Practice Providers (APPs -  Physician Assistants and Nurse Practitioners) who all work together to provide you with the care you need, when you need it.  Your next appointment:   6 month(s)  The format for your next appointment:   In Person  Provider:   K. [place] Hilty, MD  Other Instructions Referral to Pulmonology-- someone will reach out to you to schedule

## 2019-12-31 NOTE — Addendum Note (Signed)
Addended by: Chana Bode on: 12/31/2019 01:24 PM   Modules accepted: Orders

## 2020-01-26 ENCOUNTER — Encounter: Payer: Self-pay | Admitting: Internal Medicine

## 2020-01-26 ENCOUNTER — Other Ambulatory Visit: Payer: Self-pay

## 2020-01-26 ENCOUNTER — Ambulatory Visit (INDEPENDENT_AMBULATORY_CARE_PROVIDER_SITE_OTHER): Payer: Medicare Other | Admitting: Internal Medicine

## 2020-01-26 VITALS — BP 130/70 | HR 76 | Temp 97.9°F | Ht 62.0 in | Wt 221.8 lb

## 2020-01-26 DIAGNOSIS — R0602 Shortness of breath: Secondary | ICD-10-CM | POA: Diagnosis not present

## 2020-01-26 DIAGNOSIS — Z23 Encounter for immunization: Secondary | ICD-10-CM | POA: Diagnosis not present

## 2020-01-26 MED ORDER — ALBUTEROL SULFATE HFA 108 (90 BASE) MCG/ACT IN AERS: 2.0000 | INHALATION_SPRAY | Freq: Four times a day (QID) | RESPIRATORY_TRACT | 5 refills | Status: DC | PRN

## 2020-01-26 NOTE — Patient Instructions (Signed)
The patient should have follow up scheduled with myself in 6 weeks.   Prior to next visit patient should have: Full set of PFTs  Take the albuterol rescue inhaler every 4 to 6 hours as needed for wheezing or shortness of breath. You can also take it 15 minutes before exercise or exertional activity. Side effects include heart racing or pounding, jitters or anxiety. If you have a history of an irregular heart rhythm, it can make this worse. Can also give some patients a hard time sleeping.  To inhale the aerosol using an inhaler, follow these steps:  1. Remove the protective dust cap from the end of the mouthpiece. If the dust cap was not placed on the mouthpiece, check the mouthpiece for dirt or other objects. Be sure that the canister is fully and firmly inserted in the mouthpiece. 2. If you are using the inhaler for the first time or if you have not used the inhaler in more than 14 days, you will need to prime it. You may also need to prime the inhaler if it has been dropped. Ask your pharmacist or check the manufacturer's information if this happens. To prime the inhaler, shake it well and then press down on the canister 4 times to release 4 sprays into the air, away from your face. Be careful not to get albuterol in your eyes. 3. Shake the inhaler well. 4. Breathe out as completely as possible through your mouth. 4. Hold the canister with the mouthpiece on the bottom, facing you and the canister pointing upward. Place the open end of the mouthpiece into your mouth. Close your lips tightly around the mouthpiece. 6. Breathe in slowly and deeply through the mouthpiece.At the same time, press down once on the container to spray the medication into your mouth. 7. Try to hold your breath for 10 seconds. remove the inhaler, and breathe out slowly. 8. If you were told to use 2 puffs, wait 1 minute and then repeat steps 3-7. 9. Replace the protective cap on the inhaler. 10. Clean your inhaler  regularly. Follow the manufacturer's directions carefully and ask your doctor or pharmacist if you have any questions about cleaning your inhaler.  Check the back of the inhaler to keep track of the total number of doses left on the inhaler.    

## 2020-01-26 NOTE — Progress Notes (Signed)
Judy Wells    [id number]    1935-02-22  Primary Care Physician:Bouska, Onalee Hua, MD  Referring Physician: Chrystie Nose, MD [address] [place],  [place] [address] Reason for Consultation: shortness of breath Date of Consultation: 01/26/2020  Chief complaint:   Chief Complaint  Patient presents with  . Consult    doe for 2 years, cough with dark sputum.  last 2 days breathing worse.     HPI: Judy Wells is a 85 y.o. woman with a history of WPW s/p ablation as well as cardiac arrest (although heart cath shows non obstructive CAD. Here for dyspnea on exertion. Notes 2-3 years of shortness of breath which has been progressing slowly. She also has shaking which she attributes to her niece dying unexpectedly from an aneurysm. She has dyspnea with minimal walking (bedroom to kitchen,) doing laundry. She cough during the day occasionally when she first wakes up in the morning. She's been gargling with salt water which helps. She has some globus sensation as well. She has history of bad acid reflux and doesn't feel it is as bad. She hasn't taken prilosec in a few days because she forgot. No sinus drainage or congestion. She does have wheezing and chest tightness as well. Also drinks hot tea with honey which helps her symptoms.   No childhood respiratory disease.  Social history:  Occupation: worked at [employer] for 43 years.  worked as a Lawyer as well and in Pharmacologist.  Exposures: lives with husband, has a Emergency planning/management officer. Originally from [place], lived in [place] for ten years and has been in [place] for many years Smoking history: never smoked, no significant passive smoke exposure  Social History   Occupational History    Employer: [employer]  Tobacco Use  . Smoking status: Never Smoker  . Smokeless tobacco: Never Used  Vaping Use  . Vaping Use: Never used  Substance and Sexual Activity  . Alcohol use: Yes    Alcohol/week: 0.0 standard drinks    Comment:  Rare  . Drug use: No  . Sexual activity: Never    Comment: 1st intercourse 85 yo-Fewer than 5 partners    Relevant family history:  Family History  Problem Relation Age of Onset  . Dementia Mother   . Stroke Father   . Stroke Maternal Grandmother   . Aneurysm Sister   . Heart disease Cousin   . Stroke Sister     Past Medical History:  Diagnosis Date  . Anemia   . Coronary artery disease 1980's  . Diabetes mellitus type II   . Diverticulosis 2011  . GERD (gastroesophageal reflux disease)   . H. pylori infection 11/2011  . Hiatal hernia   . HTN (hypertension)   . Hypothyroid   . Myocardial infarction (HCC)    secondary to WPW  . Osteoarthritis   . Osteopenia   . Vitamin D deficiency   . Wolf-Parkinson-White syndrome 1999   s/p ablation in 1997 (Dr. Odessa Fleming) - after cardiac arrest while in [place]    Past Surgical History:  Procedure Laterality Date  . ABDOMINAL ADHESION SURGERY    . ABLATION  05/19/1997   Dr. Graciela Husbands - bidirectional conduction of right posterolateral AT - RF ablation  . APPENDECTOMY    . BREAST SURGERY     breast lump removed - tiny  . CARDIAC CATHETERIZATION  02/19/1997   noraml L main, LAD free of disease, diagonal with no obstructions,  L Cfx free of disease (along with OM & PDA being normal), RCA small & non-dominant & free of disease (Dr. Mervyn Skeeters. [facility])  . CATARACT EXTRACTION    . COLONOSCOPY    . NM MYOCAR PERF WALL MOTION  01/2012   lexiscan myoview - normal pattern of perfusion in all regions, EF 64%     Physical Exam: Blood pressure 130/70, pulse 76, temperature 97.9 F (36.6 C), temperature source Temporal, height 5\' 2"  (1.575 m), weight 221 lb 12.8 oz (100.6 kg), SpO2 96 %. Gen:      No acute distress ENT:  no nasal polyps, mucus membranes moist, mallampati 3 Lungs:    No increased respiratory effort, symmetric chest wall excursion, clear to auscultation bilaterally, no wheezes or crackles CV:         Regular rate and rhythm; no murmurs,  rubs, or gallops.  No pedal edema Abd:      + bowel sounds; soft, non-tender; no distension MSK: no acute synovitis of DIP or PIP joints, no mechanics hands.  Skin:      Warm and dry; no rashes Neuro: normal speech, no focal facial asymmetry Psych: alert and oriented x3, normal mood and affect   Data Reviewed/Medical Decision Making:  Independent interpretation of tests: Imaging: . Review of patient's chest xray June 2021 images revealed no acute cardiopulmonary process. The patient's images have been independently reviewed by me.    PFTs: None on file  Labs:  Lab Results  Component Value Date   WBC 4.7 05/08/2019   HGB 12.1 05/08/2019   HCT 39.2 05/08/2019   MCV 98.7 05/08/2019   PLT 236 05/08/2019   Lab Results  Component Value Date   NA 138 05/08/2019   K 3.8 05/08/2019   CL 100 05/08/2019   CO2 29 05/08/2019     Immunization status:  Immunization History  Administered Date(s) Administered  . Fluad Quad(high Dose 65+) 01/26/2020  . PFIZER SARS-COV-2 Vaccination 07/10/2019, 07/31/2019    . I reviewed prior external note(s) from urgent care, cardiology, ENT . I reviewed the result(s) of the labs and imaging as noted above.  . I have ordered PFTs  Assessment:  Shortness of breath Cough GERD on PPI  Plan/Recommendations: Differential diagnosis includes reactive airways disease, deconditioning. Will trial albuterol inhaler given episodic dyspnea with wheezing. She wonders if her cat could be contributing to symptoms. Will also obtain a full set of PFTs. Follow up after this.   She should continue PPI as I suspect this could be contributing to her globus sensation and cough  Flu shot today.   Return to Care: Return in about 4 weeks (around 02/23/2020).  13/05/2019, MD Pulmonary and Critical Care Medicine [facility] [phone]  CC: Durel Salts, Rennis Golden, MD

## 2020-03-09 ENCOUNTER — Other Ambulatory Visit: Payer: Self-pay | Admitting: Internal Medicine

## 2020-03-09 DIAGNOSIS — R0602 Shortness of breath: Secondary | ICD-10-CM

## 2020-03-10 ENCOUNTER — Ambulatory Visit: Payer: Medicare Other | Admitting: Internal Medicine

## 2020-03-10 ENCOUNTER — Encounter: Payer: Self-pay | Admitting: Internal Medicine

## 2020-03-10 ENCOUNTER — Other Ambulatory Visit: Payer: Self-pay

## 2020-03-10 ENCOUNTER — Ambulatory Visit (INDEPENDENT_AMBULATORY_CARE_PROVIDER_SITE_OTHER): Payer: Medicare Other | Admitting: Internal Medicine

## 2020-03-10 VITALS — BP 112/56 | HR 78 | Temp 98.0°F | Ht 62.0 in | Wt 221.0 lb

## 2020-03-10 DIAGNOSIS — G4733 Obstructive sleep apnea (adult) (pediatric): Secondary | ICD-10-CM

## 2020-03-10 DIAGNOSIS — K219 Gastro-esophageal reflux disease without esophagitis: Secondary | ICD-10-CM | POA: Diagnosis not present

## 2020-03-10 DIAGNOSIS — R0602 Shortness of breath: Secondary | ICD-10-CM | POA: Diagnosis not present

## 2020-03-10 LAB — PULMONARY FUNCTION TEST
DL/VA % pred: 107 %
DL/VA: 4.42 ml/min/mmHg/L
DLCO cor % pred: 86 %
DLCO cor: 15.04 ml/min/mmHg
DLCO unc % pred: 86 %
DLCO unc: 15.04 ml/min/mmHg
FEF 25-75 Post: 1.17 L/sec
FEF 25-75 Pre: 1.22 L/sec
FEF2575-%Change-Post: -4 %
FEF2575-%Pred-Post: 110 %
FEF2575-%Pred-Pre: 114 %
FEV1-%Change-Post: 0 %
FEV1-%Pred-Post: 100 %
FEV1-%Pred-Pre: 100 %
FEV1-Post: 1.3 L
FEV1-Pre: 1.31 L
FEV1FVC-%Change-Post: 7 %
FEV1FVC-%Pred-Pre: 105 %
FEV6-%Change-Post: -7 %
FEV6-%Pred-Post: 95 %
FEV6-%Pred-Pre: 103 %
FEV6-Post: 1.54 L
FEV6-Pre: 1.66 L
FEV6FVC-%Pred-Post: 105 %
FEV6FVC-%Pred-Pre: 105 %
FVC-%Change-Post: -7 %
FVC-%Pred-Post: 90 %
FVC-%Pred-Pre: 98 %
FVC-Post: 1.54 L
FVC-Pre: 1.66 L
Post FEV1/FVC ratio: 85 %
Post FEV6/FVC ratio: 100 %
Pre FEV1/FVC ratio: 79 %
Pre FEV6/FVC Ratio: 100 %
RV % pred: 68 %
RV: 1.6 L
TLC % pred: 73 %
TLC: 3.48 L

## 2020-03-10 NOTE — Progress Notes (Signed)
Judy Wells    [id number]    [birth year]  Primary Care Physician:Bouska, Onalee Hua, MD Date of Appointment: 03/10/2020 Established Patient Visit  Chief complaint:   Chief Complaint  Patient presents with  . Follow-up    shortness of breath when walking, no wheezing, no coughing, review PFT     HPI: Judy Wells is a 85 y.o. woman with a history of WPW s/p ablation as well as cardiac arrest (although heart cath shows non obstructive CAD. Here for dyspnea on exertion. Notes 2-3 years of shortness of breath which has been progressing slowly. She also has shaking which she attributes to her niece dying unexpectedly from an aneurysm. She has dyspnea with minimal walking (bedroom to kitchen,) doing laundry. She cough during the day occasionally when she first wakes up in the morning. She's been gargling with salt water which helps. She has some globus sensation as well. She has history of bad acid reflux and doesn't feel it is as bad. She hasn't taken prilosec in a few days because she forgot. No sinus drainage or congestion. She does have wheezing and chest tightness as well. Also drinks hot tea with honey which helps her symptoms.   No childhood respiratory disease.  Interval Updates: Here for follow up today after PFTs.  Taking albuterol inhaler about once a day and once at night. It does help her a little bit. Unclear why she is taking albuterol at night time but says she has poor sleep. Drinks lots of caffeinated tea during the day and still having reflux.   OBSTRUCTIVE SLEEP APNEA SCREENING  1.  Snoring?:  no 2.  Tired?:  Yes 3.  Observed apnea, stop breathing or choking/gasping during sleep?:  No 4.  Pressure. HTN history?  no 5.  BMI more than 35 kg/m2?  yes 6.  Age more than 50 yrs?  yes 7.  Neck size larger than 17 in for female or 16 in for female?  yes 8.  Gender = Female?  no  Total:  4  For general population  OSA - Low Risk : Yes to 0 - 2 questions OSA -  Intermediate Risk : Yes to 3 - 4 questions OSA - High Risk : Yes to 5 - 8 questions  or Yes to 2 or more of 4 STOP questions + female gender or Yes to 2 or more of 4 STOP questions + BMI > 35kg/m2  or Yes to 2 or more of 4 STOP questions + neck circumference 17 inches / 43cm in female or 16 inches / 41cm in female  References: Landry Dyke al. Anesthesiology 2008; 108: 812-821,  Beecher Mcardle et al Br Michela Pitcher 2012; 108: 967-591,  Beecher Mcardle et al J Clin Sleep Med Sept 2014.   I have reviewed the patient's family social and past medical history and updated as appropriate.   Past Medical History:  Diagnosis Date  . Anemia   . Coronary artery disease 1980's  . Diabetes mellitus type II   . Diverticulosis 2011  . GERD (gastroesophageal reflux disease)   . H. pylori infection 11/2011  . Hiatal hernia   . HTN (hypertension)   . Hypothyroid   . Myocardial infarction (HCC)    secondary to WPW  . Osteoarthritis   . Osteopenia   . Vitamin D deficiency   . Wolf-Parkinson-White syndrome 1999   s/p ablation in 1997 (Dr. Odessa Fleming) - after cardiac arrest while in  [place]    Past Surgical History:  Procedure Laterality Date  . ABDOMINAL ADHESION SURGERY    . ABLATION  05/19/1997   Dr. Graciela Husbands - bidirectional conduction of right posterolateral AT - RF ablation  . APPENDECTOMY    . BREAST SURGERY     breast lump removed - tiny  . CARDIAC CATHETERIZATION  02/19/1997   noraml L main, LAD free of disease, diagonal with no obstructions, L Cfx free of disease (along with OM & PDA being normal), RCA small & non-dominant & free of disease (Dr. Langston Reusing)  . CATARACT EXTRACTION    . COLONOSCOPY    . NM MYOCAR PERF WALL MOTION  01/2012   lexiscan myoview - normal pattern of perfusion in all regions, EF 64%    Family History  Problem Relation Age of Onset  . Dementia Mother   . Stroke Father   . Stroke Maternal Grandmother   . Aneurysm Sister   . Heart disease Cousin   . Stroke Sister     Social  History   Occupational History    Employer: [employer]  Tobacco Use  . Smoking status: Never Smoker  . Smokeless tobacco: Never Used  Vaping Use  . Vaping Use: Never used  Substance and Sexual Activity  . Alcohol use: Yes    Alcohol/week: 0.0 standard drinks    Comment: Rare  . Drug use: No  . Sexual activity: Never    Comment: 1st intercourse 85 yo-Fewer than 5 partners     Physical Exam: Blood pressure (!) 112/56, pulse 78, temperature 98 F (36.7 C), temperature source Temporal, height 5\' 2"  (1.575 m), weight 221 lb (100.2 kg), SpO2 96 %.  Gen:      No acute distress Lungs:    No increased respiratory effort, symmetric chest wall excursion, clear to auscultation bilaterally, no wheezes or crackles CV:         Regular rate and rhythm; no murmurs, rubs, or gallops.  No pedal edema   Data Reviewed: Imaging: I have personally reviewed the   PFTs:  PFT Results Latest Ref Rng & Units 03/10/2020  FVC-Pre L 1.66  FVC-Predicted Pre % 98  FVC-Post L 1.54  FVC-Predicted Post % 90  Pre FEV1/FVC % % 79  Post FEV1/FCV % % 85  FEV1-Pre L 1.31  FEV1-Predicted Pre % 100  FEV1-Post L 1.30  DLCO uncorrected ml/min/mmHg 15.04  DLCO UNC% % 86  DLCO corrected ml/min/mmHg 15.04  DLCO COR %Predicted % 86  DLVA Predicted % 107  TLC L 3.48  TLC % Predicted % 73  RV % Predicted % 68   I have personally reviewed the patient's PFTs and they show mild restriction to ventilation secondary to body habitus  Labs: Lab Results  Component Value Date   WBC 4.7 05/08/2019   HGB 12.1 05/08/2019   HCT 39.2 05/08/2019   MCV 98.7 05/08/2019   PLT 236 05/08/2019    Immunization status: Immunization History  Administered Date(s) Administered  . Fluad Quad(high Dose 65+) 01/26/2020  . Influenza, Seasonal, Injecte, Preservative Fre 01/09/2016  . PFIZER SARS-COV-2 Vaccination 07/10/2019, 07/31/2019  . Pneumococcal Conjugate-13 01/12/2014  . Pneumococcal Polysaccharide-23 10/21/2008, 07/06/2015   . Pneumococcal-Unspecified 10/21/2008  . Td 10/21/2008  . Tdap 06/22/2011  . Zoster 10/21/2008    Assessment:  Shortness of breath Obesity Body mass index is 40.42 kg/m. Concern for OSA  Plan/Recommendations: Mild restriction to ventilation, suspect deconditioning and obesity contributing. Continue prn albuterol Will obtain home sleep apnea test  Continue PPI. Advised her to decrease caffeine intake.    Return to Care: Return in about 3 months (around 06/10/2020).   Durel Salts, MD Pulmonary and Critical Care Medicine [facility] Office:[phone]

## 2020-03-10 NOTE — Patient Instructions (Addendum)
The patient should have follow up scheduled with myself in 3 months.   Prior to next visit patient should have: Home sleep study  Cut back on the tea - will help your sleep and reflux   What is GERD? Gastroesophageal reflux disease (GERD) is gastroesophageal reflux diseasewhich occurs when the lower esophageal sphincter (LES) opens spontaneously, for varying periods of time, or does not close properly and stomach contents rise up into the esophagus. GER is also called acid reflux or acid regurgitation, because digestive juices--called acids--rise up with the food. The esophagus is the tube that carries food from the mouth to the stomach. The LES is a ring of muscle at the bottom of the esophagus that acts like a valve between the esophagus and stomach.  When acid reflux occurs, food or fluid can be tasted in the back of the mouth. When refluxed stomach acid touches the lining of the esophagus it may cause a burning sensation in the chest or throat called heartburn or acid indigestion. Occasional reflux is common. Persistent reflux that occurs more than twice a week is considered GERD, and it can eventually lead to more serious health problems. People of all ages can have GERD. Studies have shown that GERD may worsen or contribute to asthma, chronic cough, and pulmonary fibrosis.   What are the symptoms of GERD? The main symptom of GERD in adults is frequent heartburn, also called acid indigestion--burning-type pain in the lower part of the mid-chest, behind the breast bone, and in the mid-abdomen.  Not all reflux is acidic in nature, and many patients don't have heart burn at all. Sometimes it feels like a cough (either dry or with mucus), choking sensation, asthma, shortness of breath, waking up at night, frequent throat clearing, or trouble swallowing.    What causes GERD? The reason some people develop GERD is still unclear. However, research shows that in people with GERD, the LES relaxes  while the rest of the esophagus is working. Anatomical abnormalities such as a hiatal hernia may also contribute to GERD. A hiatal hernia occurs when the upper part of the stomach and the LES move above the diaphragm, the muscle wall that separates the stomach from the chest. Normally, the diaphragm helps the LES keep acid from rising up into the esophagus. When a hiatal hernia is present, acid reflux can occur more easily. A hiatal hernia can occur in people of any age and is most often a normal finding in otherwise healthy people over age 43. Most of the time, a hiatal hernia produces no symptoms.   Other factors that may contribute to GERD include - Obesity or recent weight gain - Pregnancy  - Smoking  - Diet - Certain medications  Common foods that can worsen reflux symptoms include: - carbonated beverages - artificial sweeteners - citrus fruits  - chocolate  - drinks with caffeine or alcohol  - fatty and fried foods  - garlic and onions  - mint flavorings  - spicy foods  - tomato-based foods, like spaghetti sauce, salsa, chili, and pizza   Lifestyle Changes If you smoke, stop.  Avoid foods and beverages that worsen symptoms (see above.) Lose weight if needed.  Eat small, frequent meals.  Wear loose-fitting clothes.  Avoid lying down for 3 hours after a meal.  Raise the head of your bed 6 to 8 inches by securing wood blocks under the bedposts. Just using extra pillows will not help, but using a wedge-shaped pillow may be helpful.  Medications  H2 blockers, such as cimetidine (Tagamet HB), famotidine (Pepcid AC), nizatidine (Axid AR), and ranitidine (Zantac 75), decrease acid production. They are available in prescription strength and over-the-counter strength. These drugs provide short-term relief and are effective for about half of those who have GERD symptoms.  Proton pump inhibitors include omeprazole (Prilosec, Zegerid), lansoprazole (Prevacid), pantoprazole (Protonix),  rabeprazole (Aciphex), and esomeprazole (Nexium), which are available by prescription. Prilosec is also available in over-the-counter strength. Proton pump inhibitors are more effective than H2 blockers and can relieve symptoms and heal the esophageal lining in almost everyone who has GERD.  Because drugs work in different ways, combinations of medications may help control symptoms. People who get heartburn after eating may take both antacids and H2 blockers. The antacids work first to neutralize the acid in the stomach, and then the H2 blockers act on acid production. By the time the antacid stops working, the H2 blocker will have stopped acid production. Your health care provider is the best source of information about how to use medications for GERD.   Points to Remember 1. You can have GERD without having heartburn. Your symptoms could include a dry cough, asthma symptoms, or trouble swallowing.  2. Taking medications daily as prescribed is important in controlling you symptoms.  Sometimes it can take up to 8 weeks to fully achieve the effects of the medications prescribed.  3. Coughing related to GERD can be difficult to treat and is very frustrating!  However, it is important to stick with these medications and lifestyle modifications before pursuing more aggressive or invasive test and treatments.

## 2020-03-10 NOTE — Progress Notes (Signed)
PFT done today. 

## 2020-03-31 ENCOUNTER — Telehealth: Payer: Self-pay | Admitting: Internal Medicine

## 2020-03-31 NOTE — Telephone Encounter (Signed)
This patient is returning a call.

## 2020-04-01 NOTE — Telephone Encounter (Signed)
Called spoke with patient.  It was the sleep study  She is set up Nothing further needed at this time.

## 2020-04-01 NOTE — Telephone Encounter (Signed)
I called and spoke to the patient the other day and set up a home study it wasn't a [facility] that called

## 2020-04-05 ENCOUNTER — Other Ambulatory Visit: Payer: Self-pay

## 2020-04-05 ENCOUNTER — Ambulatory Visit: Payer: Medicare Other

## 2020-04-05 DIAGNOSIS — G4733 Obstructive sleep apnea (adult) (pediatric): Secondary | ICD-10-CM

## 2020-04-07 DIAGNOSIS — G4733 Obstructive sleep apnea (adult) (pediatric): Secondary | ICD-10-CM

## 2020-05-16 ENCOUNTER — Encounter: Payer: Medicare Other | Admitting: Obstetrics and Gynecology

## 2020-05-17 ENCOUNTER — Ambulatory Visit: Payer: Medicare Other | Admitting: Obstetrics and Gynecology

## 2020-06-02 ENCOUNTER — Encounter: Payer: Self-pay | Admitting: Obstetrics and Gynecology

## 2020-06-02 ENCOUNTER — Ambulatory Visit: Payer: Medicare Other | Admitting: Obstetrics and Gynecology

## 2020-06-02 ENCOUNTER — Other Ambulatory Visit: Payer: Self-pay

## 2020-06-02 VITALS — BP 130/70 | HR 80 | Ht 62.0 in | Wt 217.0 lb

## 2020-06-02 DIAGNOSIS — M810 Age-related osteoporosis without current pathological fracture: Secondary | ICD-10-CM | POA: Diagnosis not present

## 2020-06-02 DIAGNOSIS — Z01419 Encounter for gynecological examination (general) (routine) without abnormal findings: Secondary | ICD-10-CM

## 2020-06-02 NOTE — Progress Notes (Signed)
AYAME RENA Apr 16, 1935 [id number]  SUBJECTIVE:  86 y.o. [id number] female here for annual routine breast and pelvic exam.  No gynecologic concerns.  Current Outpatient Medications  Medication Sig Dispense Refill  . acetaminophen (TYLENOL) 650 MG CR tablet Take 650 mg by mouth every 8 (eight) hours as needed for pain.    Marland Kitchen albuterol (VENTOLIN HFA) 108 (90 Base) MCG/ACT inhaler Inhale 2 puffs into the lungs every 6 (six) hours as needed. 18 g 5  . aspirin 81 MG tablet Take 81 mg by mouth daily.     . Calcium Carbonate-Vitamin D 600-400 MG-UNIT per tablet Take 1 tablet by mouth.    Marland Kitchen FLAXSEED, LINSEED, PO Take by mouth.    . furosemide (LASIX) 20 MG tablet Take 20 mg by mouth daily.    Marland Kitchen gabapentin (NEURONTIN) 100 MG capsule Take 100 mg by mouth 2 (two) times daily.    Marland Kitchen glipiZIDE (GLUCOTROL) 10 MG tablet Take 10 mg by mouth daily.    Marland Kitchen glucose blood (FREESTYLE LITE) test strip 1 applicator.    . IRON PO Take by mouth. Slow release    . levothyroxine (SYNTHROID, LEVOTHROID) 112 MCG tablet Take 100 mcg by mouth daily before breakfast.     . metFORMIN (GLUCOPHAGE) 1000 MG tablet Take 1,000 mg by mouth 2 (two) times daily with a meal.     . omeprazole (PRILOSEC) 40 MG capsule Take 40 mg by mouth daily.    . propranolol ER (INDERAL LA) 60 MG 24 hr capsule TAKE 1 CAPSULE BY MOUTH EVERY DAY 30 capsule 11   No current facility-administered medications for this visit.   Allergies: Atorvastatin and Pravachol [pravastatin sodium]  No LMP recorded. Patient is postmenopausal.  Past medical history,surgical history, problem list, medications, allergies, family history and social history were all reviewed and documented as reviewed in the EPIC chart.  ROS: Positives and negatives as reviewed in HPI   OBJECTIVE:  The patient appears well, alert, oriented, in no distress. BP 130/70 (BP Location: Right Arm, Patient Position: Sitting, Cuff Size: Normal)   Pulse 80   Ht 5\' 2"  (1.575 m)   Wt 217 lb  (98.4 kg)   BMI 39.69 kg/m   BREAST EXAM: right breast normal without mass, skin or nipple changes or axillary nodes, no skin or nipple changes or axillary nodes  PELVIC EXAM: VULVA: normal appearing vulva with no masses, tenderness or lesions, +atrophy changes, VAGINA: normal appearing vagina with normal color and discharge, no lesions, CERVIX: normal appearing cervix without discharge or lesions, UTERUS: uterus is normal size, shape, consistency and nontender, ADNEXA: normal adnexa in size, nontender and no masses, RECTAL: normal rectal, no masses.    Chaperone: present during the examination  ASSESSMENT:  86 yo 97 here for annual gynecologic exam  PLAN:  1. Postmenopausal.  No concerns with vaginal symptoms or bleeding.  No vasomotor symptom concerns. 2. Pap smear surveillance concluded based on guidelines due to patient's age, and she is in agreement with this plan.  Normal pelvic exam today. 3. Mammogram.  Last appears to be 2016 according to our records.  Recommended continuing with annual mammography through age 11.  4. Colonoscopy 2011.  She will discuss whether to continue with screening with her primary care provider. 5. Osteoporosis.  Last known DEXA 2017.  I recommend she consider scheduling a repeat DEXA soon.  6. Asymptomatic umbilical hernia on exam. 7. Health maintenance. Routine lab work is with her primary care provider's office.  Return  annually or prn.  Theresia Majors MD  06/02/20

## 2020-06-20 ENCOUNTER — Other Ambulatory Visit: Payer: Self-pay

## 2020-06-20 ENCOUNTER — Ambulatory Visit: Payer: Medicare Other | Admitting: Internal Medicine

## 2020-06-20 VITALS — BP 150/72 | Ht 63.0 in | Wt 215.8 lb

## 2020-06-20 DIAGNOSIS — I10 Essential (primary) hypertension: Secondary | ICD-10-CM | POA: Diagnosis not present

## 2020-06-20 DIAGNOSIS — I456 Pre-excitation syndrome: Secondary | ICD-10-CM

## 2020-06-20 DIAGNOSIS — R0600 Dyspnea, unspecified: Secondary | ICD-10-CM

## 2020-06-20 DIAGNOSIS — R0609 Other forms of dyspnea: Secondary | ICD-10-CM

## 2020-06-20 NOTE — Patient Instructions (Signed)
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
## 2020-12-07 IMAGING — DX DG CHEST 2V
2 series · 2 of 2 positions shown · non-contrast
Comparison: 05/06/2018

CLINICAL DATA: Shortness of breath with cough and chest tightness
as well as wheezing 2 weeks.

EXAM:
CHEST - 2 VIEW

[chest pa]
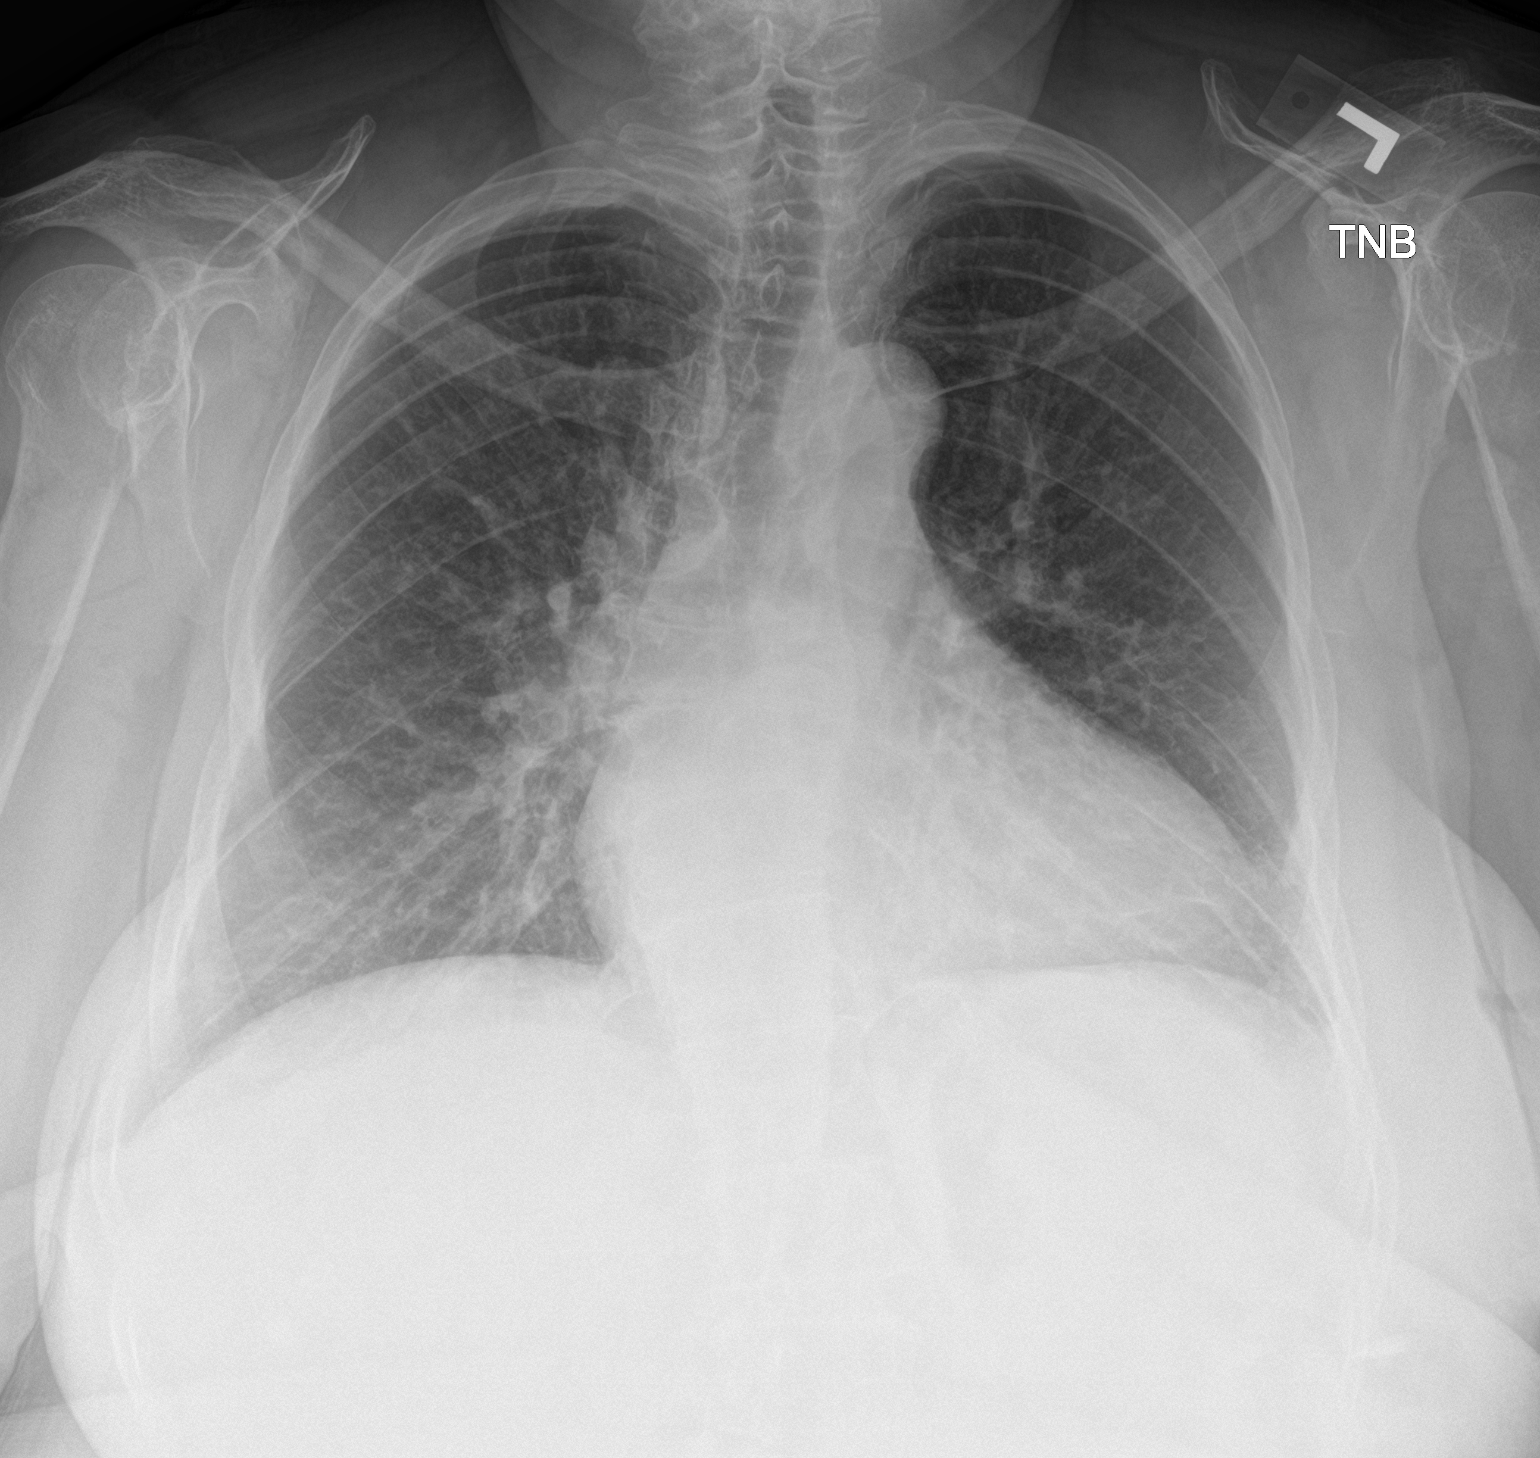

[chest lat]
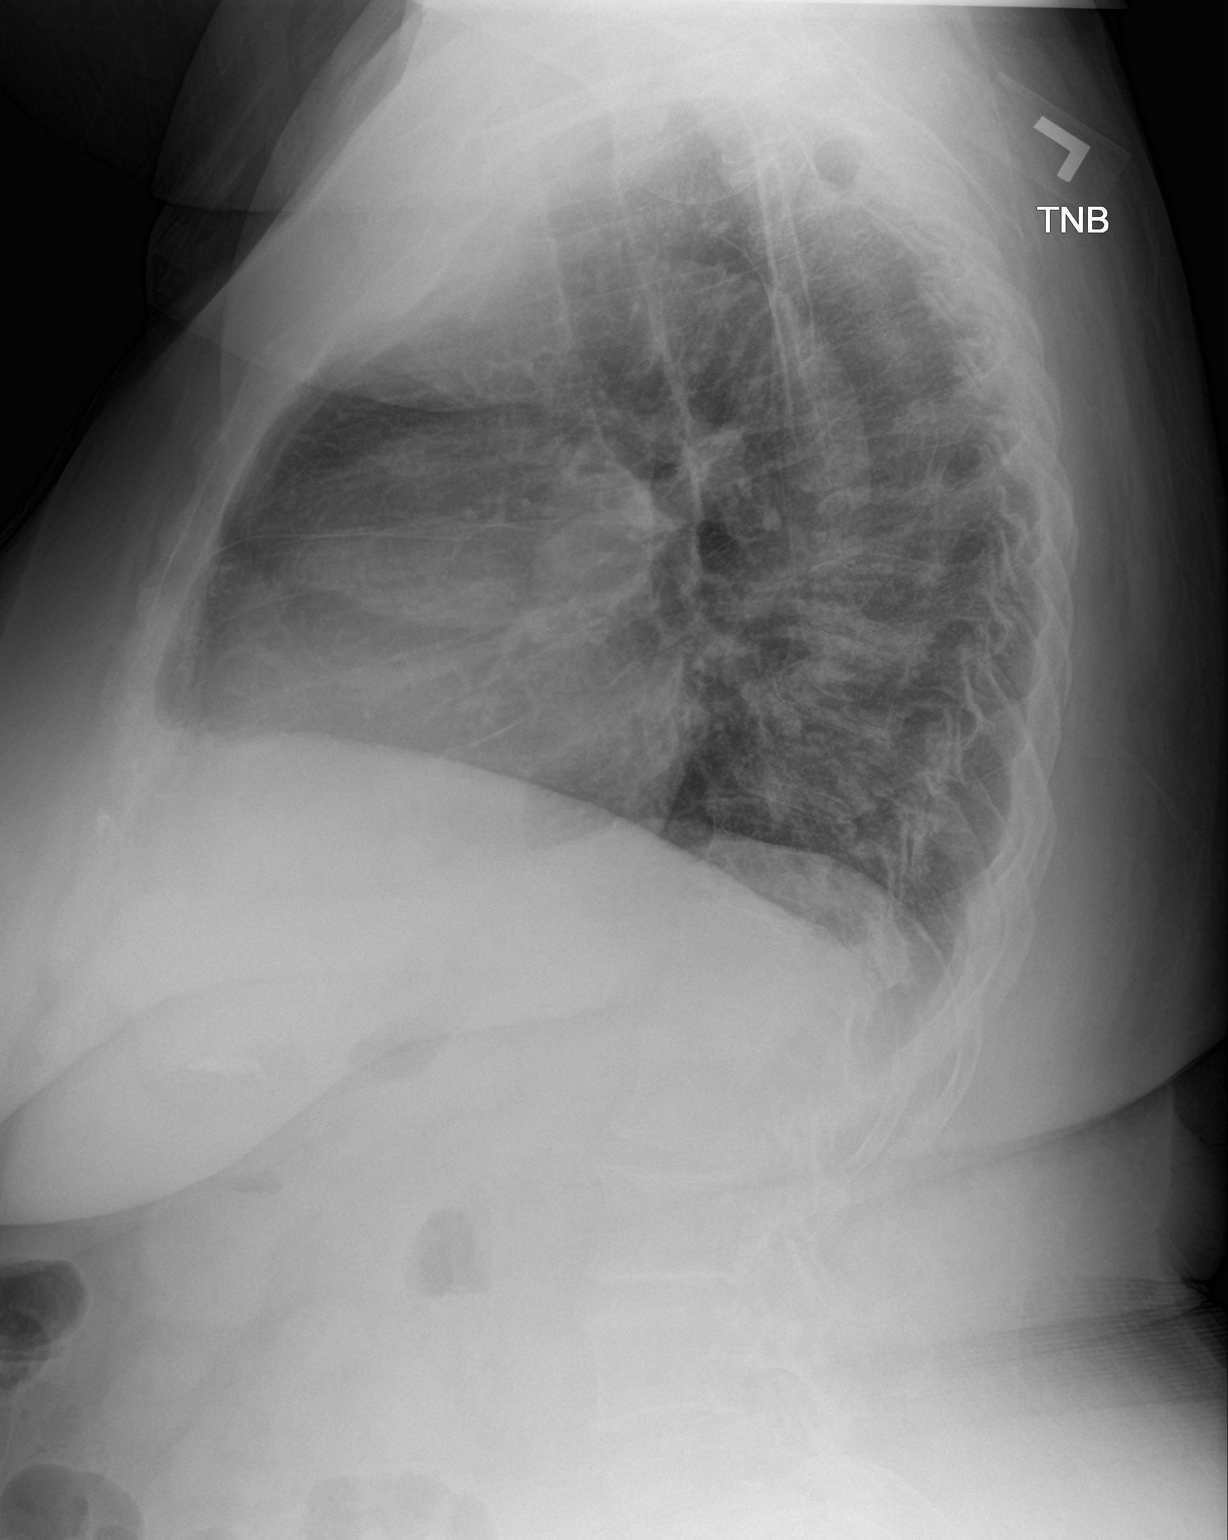

[2 of 2 positions shown; findings below may reference images not displayed]

FINDINGS: Lungs are adequately inflated without lobar consolidation or
effusion. Minimal hazy prominence of the central vessels likely mild
vascular congestion. Borderline cardiomegaly. Remainder of the exam
is unchanged.
IMPRESSION: Borderline cardiomegaly with suggestion of minimal vascular
congestion.

## 2022-03-13 ENCOUNTER — Encounter: Payer: Self-pay | Admitting: Physician Assistant

## 2022-03-13 ENCOUNTER — Ambulatory Visit: Payer: Medicare Other | Attending: Physician Assistant | Admitting: Physician Assistant

## 2022-03-13 VITALS — BP 122/52 | HR 65 | Ht 62.0 in | Wt 216.0 lb

## 2022-03-13 DIAGNOSIS — I456 Pre-excitation syndrome: Secondary | ICD-10-CM

## 2022-03-13 DIAGNOSIS — M542 Cervicalgia: Secondary | ICD-10-CM | POA: Diagnosis not present

## 2022-03-13 DIAGNOSIS — I10 Essential (primary) hypertension: Secondary | ICD-10-CM | POA: Diagnosis not present

## 2022-03-13 DIAGNOSIS — R0609 Other forms of dyspnea: Secondary | ICD-10-CM | POA: Diagnosis not present

## 2022-03-13 MED ORDER — GABAPENTIN 100 MG PO CAPS: 100.0000 mg | ORAL_CAPSULE | Freq: Two times a day (BID) | ORAL | 1 refills | Status: AC

## 2022-03-13 NOTE — Progress Notes (Unsigned)
Cardiology Office Note:    Date:  03/15/2022   ID:  Judy Wells, DOB February 21, 1935, MRN [id number]  PCP:  Judy Limbo, MD   [place] Providers Cardiologist:  Judy Casino, MD     Referring MD: Judy Limbo, MD   Chief Complaint  Patient presents with   Follow-up    Seen for Dr. Debara Wells    History of Present Illness:    Judy Wells is a 87 y.o. female with a hx of DM 2, hypertension, hypothyroidism and history of Wolff-Parkinson-White syndrome s/p ablation in 1997.  Previous cardiac catheterization by Dr. Chase Wells on 02/19/1997 demonstrated EF 61%, no evidence of coronary artery disease with left dominant system. Myoview obtained in October 2013 showed normal EF 64%, normal perfusion without ischemia or previous history of infarction.  Although she has CAD listed, however no known history of CAD other than had a possible MI at the time of cardiac arrest in [place]. She has been followed by Dr. Leta Wells of neurology service for tremor.  Echocardiogram obtained on 05/19/2018 showed EF 60 to 65%, moderate LAE, mild MR.  I last saw the patient on 05/07/2019 for chest pain, stat troponin was negative.  Subsequent Myoview obtained on 05/20/2019 showed EF 60 to 60%, medium defect of mild severity with fixed apical and inferoapical perfusion defect consistent with artifact, no reversible ischemia was noted, overall low risk study.  She was seen by Dr. Sharen Wells in November 2021 for shortness of breath.  PFT obtained in November 2021 showed mild restriction to ventilation secondary to body habitus.  Diffusion capacity was normal.  Lung volumes showed mild restriction to visualization.  Her shortness of breath with exertion was felt to be due to combination of deconditioning and obesity.  Patient presents today for follow-up.  Her breathing is fairly stable.  She is still taking care of her husband who has severe dementia, this place a lot of stress on her.  She denies  any recent chest pain.  In the past few days, she has been noticing intermittent right neck pain that last 3 minutes at a time and self resolved.  The neck pain is no worse than when she look left or right, it is no worse with cough or palpation.  She does sleep on her right side.  Her EKG does not show significant ischemic changes.  Her symptom is somewhat atypical.  The symptom is not occurring with physical activity.  I recommended continue observation, use ibuprofen as needed.  She does need a temporary refill of her gabapentin prior to her PCP follow-up in January.  I gave her a one-time prescription of gabapentin enough to last her until she see her PCP.  If her symptoms improve on the next follow-up, I would not recommend any further work-up.  She can follow-up in 4 to 6 weeks.  She is aware to contact us if her symptom worsens.   Past Medical History:  Diagnosis Date   Anemia    Coronary artery disease 1980's   Diabetes mellitus type II    Diverticulosis 2011   GERD (gastroesophageal reflux disease)    H. pylori infection 11/2011   Hiatal hernia    HTN (hypertension)    Hypothyroid    Myocardial infarction ([facility])    secondary to WPW   Osteoarthritis    Osteopenia    Vitamin D deficiency    Wolf-Parkinson-White syndrome 1999   s/p ablation in 1997 (Judy Wells) -  after cardiac arrest while in [place]    Past Surgical History:  Procedure Laterality Date   ABDOMINAL ADHESION SURGERY     ABLATION  05/19/1997   Dr. Graciela Wells - bidirectional conduction of right posterolateral AT - RF ablation   APPENDECTOMY     BREAST SURGERY     breast lump removed - tiny   CARDIAC CATHETERIZATION  02/19/1997   noraml L main, LAD free of disease, diagonal with no obstructions, L Cfx free of disease (along with OM & PDA being normal), RCA small & non-dominant & free of disease (Dr. Langston Wells)   CATARACT EXTRACTION     COLONOSCOPY     NM MYOCAR PERF WALL MOTION  01/2012   lexiscan myoview - normal  pattern of perfusion in all regions, EF 64%    Current Medications: Current Meds  Medication Sig   acetaminophen (TYLENOL) 650 MG CR tablet Take 650 mg by mouth every 8 (eight) hours as needed for pain.   albuterol (VENTOLIN HFA) 108 (90 Base) MCG/ACT inhaler Inhale 2 puffs into the lungs every 6 (six) hours as needed.   aspirin 81 MG tablet Take 81 mg by mouth daily.    Calcium Carbonate-Vitamin D 600-400 MG-UNIT per tablet Take 1 tablet by mouth.   FLAXSEED, LINSEED, PO Take by mouth.   glipiZIDE (GLUCOTROL) 10 MG tablet Take 10 mg by mouth daily.   IRON PO Take by mouth. Slow release   levothyroxine (SYNTHROID, LEVOTHROID) 112 MCG tablet Take 100 mcg by mouth daily before breakfast.    metFORMIN (GLUCOPHAGE) 1000 MG tablet Take 1,000 mg by mouth 2 (two) times daily with a meal.    omeprazole (PRILOSEC) 40 MG capsule Take 40 mg by mouth daily.   propranolol ER (INDERAL LA) 60 MG 24 hr capsule TAKE 1 CAPSULE BY MOUTH EVERY DAY     Allergies:   Atorvastatin and Pravachol [pravastatin sodium]   Social History   Socioeconomic History   Marital status: Married    Spouse name: Richard   Number of children: 4   Years of education: 13   Highest education level: Not on file  Occupational History    Employer: [employer]  Tobacco Use   Smoking status: Never   Smokeless tobacco: Never  Vaping Use   Vaping Use: Never used  Substance and Sexual Activity   Alcohol use: Yes    Alcohol/week: 0.0 standard drinks of alcohol    Comment: Rare   Drug use: No   Sexual activity: Not Currently    Comment: 1st intercourse 87 yo-Fewer than 5 partners  Other Topics Concern   Not on file  Social History Narrative   Married, lives at home with husband   Caffeine use- coffee 3 cups daily   Social Determinants of Health   Financial Resource Strain: Not on file  Food Insecurity: Not on file  Transportation Needs: Not on file  Physical Activity: Not on file  Stress: Not on file  Social  Connections: Not on file     Family History: The patient's family history includes Aneurysm in her sister; Dementia in her mother; Heart disease in her cousin; Stroke in her father, maternal grandmother, and sister.  ROS:   Please see the history of present illness.     All other systems reviewed and are negative.  EKGs/Labs/Other Studies Reviewed:    The following studies were reviewed today:  Myoview 05/20/2019 The left ventricular ejection fraction is hyperdynamic (>65%). Nuclear stress EF: 66%. No T wave  inversion was noted during stress. There was no ST segment deviation noted during stress. Defect 1: There is a medium defect of mild severity. This is a low risk study.   Medium size, mild severity mostly fixed apical, inferoapical perfusion defect, suspect artifact. No reversible ischemia. LVEF 66% with normal wall motion. This is a low risk study.  EKG:  EKG is ordered today.  The ekg ordered today demonstrates normal sinus rhythm, no significant ST-T wave changes.  Recent Labs: No results found for requested labs within last 365 days.  Recent Lipid Panel No results found for: "CHOL", "TRIG", "HDL", "CHOLHDL", "VLDL", "LDLCALC", "LDLDIRECT"   Risk Assessment/Calculations:           Physical Exam:    VS:  BP (!) 122/52 (BP Location: Left Arm, Patient Position: Sitting, Cuff Size: Large)   Pulse 65   Ht 5\' 2"  (1.575 m)   Wt 216 lb (98 kg)   SpO2 92%   BMI 39.51 kg/m        Wt Readings from Last 3 Encounters:  03/13/22 216 lb (98 kg)  06/20/20 215 lb 12.8 oz (97.9 kg)  06/02/20 217 lb (98.4 kg)     GEN:  Well nourished, well developed in no acute distress HEENT: Normal NECK: No JVD; No carotid bruits LYMPHATICS: No lymphadenopathy CARDIAC: RRR, no murmurs, rubs, gallops RESPIRATORY:  Clear to auscultation without rales, wheezing or rhonchi  ABDOMEN: Soft, non-tender, non-distended MUSCULOSKELETAL:  No edema; No deformity  SKIN: Warm and  dry NEUROLOGIC:  Alert and oriented x 3 PSYCHIATRIC:  Normal affect   ASSESSMENT:    1. Neck pain   2. WPW (Wolff-Parkinson-White syndrome)   3. DOE (dyspnea on exertion)   4. Essential hypertension    PLAN:    In order of problems listed above:  Neck pain: Symptom does not occur with exertion and has been going on for the past few days.  It is not worse when she looks left or right.  I recommended ibuprofen.  She is aware to let us know if symptoms continue to increase in frequency or duration.  Suspicion for cardiac cause is fairly low.  Will reassess in 6 to 8 weeks.  If the symptoms resolve, no further workup is needed  History of WPW ablation: No significant dizziness or feeling of passing out  Dyspnea on exertion: Chronic in nature, likely related to body habitus and exercise tolerance  Hypertension: Blood pressure stable           Medication Adjustments/Labs and Tests Ordered: Current medicines are reviewed at length with the patient today.  Concerns regarding medicines are outlined above.  Orders Placed This Encounter  Procedures   EKG 12-Lead   Meds ordered this encounter  Medications   gabapentin (NEURONTIN) 100 MG capsule    Sig: Take 1 capsule (100 mg total) by mouth 2 (two) times daily.    Dispense:  60 capsule    Refill:  1    Will Defer Additional Refills to Primary Care Provider.    Patient Instructions  Medication Instructions:  No Changes *If you need a refill on your cardiac medications before your next appointment, please call your pharmacy*   Lab Work: No Labs If you have labs (blood work) drawn today and your tests are completely normal, you will receive your results only by: [place] (if you have MyChart) OR A paper copy in the mail If you have any lab test that is abnormal or we need to  change your treatment, we will call you to review the results.   Testing/Procedures: No Testing   Follow-Up: At [facility], you  and your health needs are our priority.  As part of our continuing mission to provide you with exceptional heart care, we have created designated Provider Care Teams.  These Care Teams include your primary Cardiologist (physician) and Advanced Practice Providers (APPs -  Physician Assistants and Nurse Practitioners) who all work together to provide you with the care you need, when you need it.  We recommend signing up for the patient portal called "MyChart".  Sign up information is provided on this After Visit Summary.  MyChart is used to connect with patients for Virtual Visits (Telemedicine).  Patients are able to view lab/test results, encounter notes, upcoming appointments, etc.  Non-urgent messages can be sent to your provider as well.   To learn more about what you can do with MyChart, go to [email or web address].    Your next appointment:   4-6 week(s)  The format for your next appointment:   In Person  Provider:   First Available    Other Instructions Recommended Over The Counter Ibuprofen    Signed, Judy Wells, Judy Wells  03/15/2022 9:34 PM    [place]

## 2022-03-13 NOTE — Patient Instructions (Signed)
Medication Instructions:  No Changes *If you need a refill on your cardiac medications before your next appointment, please call your pharmacy*   Lab Work: No Labs If you have labs (blood work) drawn today and your tests are completely normal, you will receive your results only by: MyChart Message (if you have MyChart) OR A paper copy in the mail If you have any lab test that is abnormal or we need to change your treatment, we will call you to review the results.   Testing/Procedures: No Testing   Follow-Up: At [facility], you and your health needs are our priority.  As part of our continuing mission to provide you with exceptional heart care, we have created designated Provider Care Teams.  These Care Teams include your primary Cardiologist (physician) and Advanced Practice Providers (APPs -  Physician Assistants and Nurse Practitioners) who all work together to provide you with the care you need, when you need it.  We recommend signing up for the patient portal called "MyChart".  Sign up information is provided on this After Visit Summary.  MyChart is used to connect with patients for Virtual Visits (Telemedicine).  Patients are able to view lab/test results, encounter notes, upcoming appointments, etc.  Non-urgent messages can be sent to your provider as well.   To learn more about what you can do with MyChart, go to [email or web address].    Your next appointment:   4-6 week(s)  The format for your next appointment:   In Person  Provider:   First Available    Other Instructions Recommended Over The Counter Ibuprofen

## 2022-03-15 ENCOUNTER — Encounter: Payer: Self-pay | Admitting: Physician Assistant

## 2022-04-09 ENCOUNTER — Encounter: Payer: Self-pay | Admitting: Physician Assistant

## 2022-04-09 ENCOUNTER — Ambulatory Visit: Payer: Medicare Other | Attending: Physician Assistant | Admitting: Physician Assistant

## 2022-04-09 VITALS — BP 140/56 | HR 67 | Ht 65.0 in | Wt 219.4 lb

## 2022-04-09 DIAGNOSIS — M542 Cervicalgia: Secondary | ICD-10-CM

## 2022-04-09 DIAGNOSIS — E119 Type 2 diabetes mellitus without complications: Secondary | ICD-10-CM

## 2022-04-09 DIAGNOSIS — I456 Pre-excitation syndrome: Secondary | ICD-10-CM

## 2022-04-09 DIAGNOSIS — I10 Essential (primary) hypertension: Secondary | ICD-10-CM | POA: Diagnosis not present

## 2022-04-09 NOTE — Progress Notes (Unsigned)
Cardiology Office Note:    Date:  04/11/2022   ID:  [name], DOB 05-21-1934, MRN [id number]  PCP:  Tracey Harries, MD    [facility] Providers Cardiologist:  Chrystie Nose, MD     Referring MD: Tracey Harries, MD   Chief Complaint  Patient presents with   Follow-up    Seen for Dr. Rennis Golden    History of Present Illness:    Judy Wells is a 87 y.o. female with a hx of DM 2, hypertension, hypothyroidism and history of Wolff-Parkinson-White syndrome s/p ablation in 1997.  Previous cardiac catheterization by Dr. Julieanne Manson on 02/19/1997 demonstrated EF 61%, no evidence of coronary artery disease with left dominant system. Myoview obtained in October 2013 showed normal EF 64%, normal perfusion without ischemia or previous history of infarction.  Although she has CAD listed, however no known history of CAD other than had a possible MI at the time of cardiac arrest in [place]. She has been followed by Dr. Marjory Lies of neurology service for tremor.  Echocardiogram obtained on 05/19/2018 showed EF 60 to 65%, moderate LAE, mild MR.  I saw the patient on 05/07/2019 for chest pain, stat troponin was negative.  Subsequent Myoview obtained on 05/20/2019 showed EF 60 to 60%, medium defect of mild severity with fixed apical and inferoapical perfusion defect consistent with artifact, no reversible ischemia was noted, overall low risk study.  She was seen by Dr. Sable Feil in November 2021 for shortness of breath.  PFT obtained in November 2021 showed mild restriction to ventilation secondary to body habitus.  Diffusion capacity was normal.  Lung volumes showed mild restriction to visualization.  Her shortness of breath with exertion was felt to be due to combination of deconditioning and obesity.    I last saw the patient on 03/13/2022 at which time her breathing was stable.  She was taking care of her husband who has dementia and has a lot of stress.  She was complaining of  intermittent right neck pain that lasted a 3 minutes at a time and self resolves.  Her EKG showed no acute changes.  Given the atypical presentation, I recommended continue observation.  She returns today for reassessment.   She has had her right neck pain has improved since the last visit.  It is not occurring with physical activity and that there is no increasing frequency or duration of the symptom.  She denies any chest pain.  She has no shortness of breath with daily activity however has shortness of breath with more strenuous activity.  This can be followed by pulmonology service.  Given the atypical nature of her neck pain, I did not recommend any further workup.  She can follow-up with Dr. Rennis Golden in 3 to 61-month.  Her blood pressure is elevated today.  Even on manual recheck, her blood pressure was 140/56.  However her blood pressure has always been well-controlled before.  I did not adjust her blood pressure medication today.  Past Medical History:  Diagnosis Date   Anemia    Coronary artery disease 1980's   Diabetes mellitus type II    Diverticulosis 2011   GERD (gastroesophageal reflux disease)    H. pylori infection 11/2011   Hiatal hernia    HTN (hypertension)    Hypothyroid    Myocardial infarction (HCC)    secondary to WPW   Osteoarthritis    Osteopenia    Vitamin D deficiency    Wolf-Parkinson-White syndrome 1999  s/p ablation in 1997 (Dr. Odessa FlemingS. Klein) - after cardiac arrest while in [place]    Past Surgical History:  Procedure Laterality Date   ABDOMINAL ADHESION SURGERY     ABLATION  05/19/1997   Dr. Graciela HusbandsKlein - bidirectional conduction of right posterolateral AT - RF ablation   APPENDECTOMY     BREAST SURGERY     breast lump removed - tiny   CARDIAC CATHETERIZATION  02/19/1997   noraml L main, LAD free of disease, diagonal with no obstructions, L Cfx free of disease (along with OM & PDA being normal), RCA small & non-dominant & free of disease (Dr. Langston ReusingA. Little)   CATARACT  EXTRACTION     COLONOSCOPY     NM MYOCAR PERF WALL MOTION  01/2012   lexiscan myoview - normal pattern of perfusion in all regions, EF 64%    Current Medications: Current Meds  Medication Sig   acetaminophen (TYLENOL) 650 MG CR tablet Take 650 mg by mouth every 8 (eight) hours as needed for pain.   albuterol (VENTOLIN HFA) 108 (90 Base) MCG/ACT inhaler Inhale 2 puffs into the lungs every 6 (six) hours as needed.   aspirin 81 MG tablet Take 81 mg by mouth daily.    Calcium Carbonate-Vitamin D 600-400 MG-UNIT per tablet Take 1 tablet by mouth.   FLAXSEED, LINSEED, PO Take by mouth.   gabapentin (NEURONTIN) 100 MG capsule Take 1 capsule (100 mg total) by mouth 2 (two) times daily.   glipiZIDE (GLUCOTROL) 10 MG tablet Take 10 mg by mouth daily.   IRON PO Take by mouth. Slow release   levothyroxine (SYNTHROID, LEVOTHROID) 112 MCG tablet Take 100 mcg by mouth daily before breakfast.    metFORMIN (GLUCOPHAGE) 1000 MG tablet Take 1,000 mg by mouth 2 (two) times daily with a meal.    omeprazole (PRILOSEC) 40 MG capsule Take 40 mg by mouth daily.   propranolol ER (INDERAL LA) 60 MG 24 hr capsule TAKE 1 CAPSULE BY MOUTH EVERY DAY     Allergies:   Atorvastatin and Pravachol [pravastatin sodium]   Social History   Socioeconomic History   Marital status: Married    Spouse name: Richard   Number of children: 4   Years of education: 13   Highest education level: Not on file  Occupational History    Employer: [employer]  Tobacco Use   Smoking status: Never   Smokeless tobacco: Never  Vaping Use   Vaping Use: Never used  Substance and Sexual Activity   Alcohol use: Yes    Alcohol/week: 0.0 standard drinks of alcohol    Comment: Rare   Drug use: No   Sexual activity: Not Currently    Comment: 1st intercourse 87 yo-Fewer than 5 partners  Other Topics Concern   Not on file  Social History Narrative   Married, lives at home with husband   Caffeine use- coffee 3 cups daily   Social  Determinants of Health   Financial Resource Strain: Not on file  Food Insecurity: Not on file  Transportation Needs: Not on file  Physical Activity: Not on file  Stress: Not on file  Social Connections: Not on file     Family History: The patient's family history includes Aneurysm in her sister; Dementia in her mother; Heart disease in her cousin; Stroke in her father, maternal grandmother, and sister.  ROS:   Please see the history of present illness.     All other systems reviewed and are negative.  EKGs/Labs/Other Studies Reviewed:  The following studies were reviewed today:  Echo 05/19/2018  1. The left ventricle is normal in size with a 60-65% ejection fraction.   2. The right ventricle has normal size and normal systolic function.   3. Moderately dilated left atrial size.   4. Normal right atrial size.   5. Mitral valve regurgitation is mild by color flow Doppler.   6. The mitral valve normal in structure and function.   7. There is mildly calcified of the mitral valve.   8. Normal tricuspid valve.   9. Tricuspid regurgitation is mild.  10. Aortic valve normal.  11. No atrial level shunt detected by color flow Doppler.   EKG:  EKG is not ordered today.    Recent Labs: No results found for requested labs within last 365 days.  Recent Lipid Panel No results found for: "CHOL", "TRIG", "HDL", "CHOLHDL", "VLDL", "LDLCALC", "LDLDIRECT"   Risk Assessment/Calculations:           Physical Exam:    VS:  BP (!) 140/56   Pulse 67   Ht 5\' 5"  (1.651 m)   Wt 219 lb 6.4 oz (99.5 kg)   SpO2 98%   BMI 36.51 kg/m        Wt Readings from Last 3 Encounters:  04/09/22 219 lb 6.4 oz (99.5 kg)  03/13/22 216 lb (98 kg)  06/20/20 215 lb 12.8 oz (97.9 kg)     GEN:  Well nourished, well developed in no acute distress HEENT: Normal NECK: No JVD; No carotid bruits LYMPHATICS: No lymphadenopathy CARDIAC: RRR, no murmurs, rubs, gallops RESPIRATORY:  Clear to auscultation  without rales, wheezing or rhonchi  ABDOMEN: Soft, non-tender, non-distended MUSCULOSKELETAL:  No edema; No deformity  SKIN: Warm and dry NEUROLOGIC:  Alert and oriented x 3 PSYCHIATRIC:  Normal affect   ASSESSMENT:    1. Neck pain   2. WPW (Wolff-Parkinson-White syndrome)   3. Essential hypertension   4. Controlled type 2 diabetes mellitus without complication, without long-term current use of insulin (HCC)    PLAN:    In order of problems listed above:  Neck pain: Symptom is atypical for angina.  Since the last visit, her neck pain has improved.  It does not have direct correlation with physical exertion.  At this time, no plan for ischemic workup  WPW: History of ablation.  No significant palpitation or dizziness.  Hypertension: Blood pressure borderline elevated, however previously it has always been very well-controlled.  Will hold off on adjusting blood pressure medication.  DM2: Managed by primary care provider.           Medication Adjustments/Labs and Tests Ordered: Current medicines are reviewed at length with the patient today.  Concerns regarding medicines are outlined above.  No orders of the defined types were placed in this encounter.  No orders of the defined types were placed in this encounter.   Patient Instructions  Medication Instructions:  No Changes *If you need a refill on your cardiac medications before your next appointment, please call your pharmacy*   Lab Work: No labs If you have labs (blood work) drawn today and your tests are completely normal, you will receive your results only by: MyChart Message (if you have MyChart) OR A paper copy in the mail If you have any lab test that is abnormal or we need to change your treatment, we will call you to review the results.   Testing/Procedures: No Testing   Follow-Up: At [facility], you and your  health needs are our priority.  As part of our continuing mission to provide you  with exceptional heart care, we have created designated Provider Care Teams.  These Care Teams include your primary Cardiologist (physician) and Advanced Practice Providers (APPs -  Physician Assistants and Nurse Practitioners) who all work together to provide you with the care you need, when you need it.  We recommend signing up for the patient portal called "MyChart".  Sign up information is provided on this After Visit Summary.  MyChart is used to connect with patients for Virtual Visits (Telemedicine).  Patients are able to view lab/test results, encounter notes, upcoming appointments, etc.  Non-urgent messages can be sent to your provider as well.   To learn more about what you can do with MyChart, go to [email or web address].    Your next appointment:   3-4 month(s)  The format for your next appointment:   In Person  Provider:   Chrystie Nose, MD   Other Instruction:  Follow up with Primary Care Provider for neck pain.    Ramond Dial, [place]  04/11/2022 4:23 PM     [facility]

## 2022-04-09 NOTE — Patient Instructions (Signed)
Medication Instructions:  No Changes *If you need a refill on your cardiac medications before your next appointment, please call your pharmacy*   Lab Work: No labs If you have labs (blood work) drawn today and your tests are completely normal, you will receive your results only by: MyChart Message (if you have MyChart) OR A paper copy in the mail If you have any lab test that is abnormal or we need to change your treatment, we will call you to review the results.   Testing/Procedures: No Testing   Follow-Up: At [facility], you and your health needs are our priority.  As part of our continuing mission to provide you with exceptional heart care, we have created designated Provider Care Teams.  These Care Teams include your primary Cardiologist (physician) and Advanced Practice Providers (APPs -  Physician Assistants and Nurse Practitioners) who all work together to provide you with the care you need, when you need it.  We recommend signing up for the patient portal called "MyChart".  Sign up information is provided on this After Visit Summary.  MyChart is used to connect with patients for Virtual Visits (Telemedicine).  Patients are able to view lab/test results, encounter notes, upcoming appointments, etc.  Non-urgent messages can be sent to your provider as well.   To learn more about what you can do with MyChart, go to [email or web address].    Your next appointment:   3-4 month(s)  The format for your next appointment:   In Person  Provider:   Chrystie Nose, MD   Other Instruction:  Follow up with Primary Care Provider for neck pain.

## 2022-04-11 ENCOUNTER — Encounter: Payer: Self-pay | Admitting: Physician Assistant

## 2022-08-14 ENCOUNTER — Ambulatory Visit: Payer: Medicare Other | Attending: Internal Medicine | Admitting: Internal Medicine

## 2022-08-14 ENCOUNTER — Encounter: Payer: Self-pay | Admitting: Internal Medicine

## 2022-08-14 VITALS — BP 100/57 | HR 72 | Ht 62.0 in | Wt 215.0 lb

## 2022-08-14 DIAGNOSIS — R600 Localized edema: Secondary | ICD-10-CM | POA: Diagnosis not present

## 2022-08-14 DIAGNOSIS — J42 Unspecified chronic bronchitis: Secondary | ICD-10-CM | POA: Diagnosis not present

## 2022-08-14 DIAGNOSIS — I5033 Acute on chronic diastolic (congestive) heart failure: Secondary | ICD-10-CM | POA: Diagnosis not present

## 2022-08-14 DIAGNOSIS — R0602 Shortness of breath: Secondary | ICD-10-CM

## 2022-08-14 MED ORDER — FUROSEMIDE 40 MG PO TABS: 40.0000 mg | ORAL_TABLET | Freq: Every day | ORAL | 3 refills | Status: AC
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
## 2022-08-14 NOTE — Patient Instructions (Signed)
Medication Instructions:  INCREASE furosemide (Lasix) to 40 mg daily  *If you need a refill on your cardiac medications before your next appointment, please call your pharmacy*   Lab Work: Please return for labs MONDAY or TUESDAY (BMET, BNP)  Our in office lab hours are Monday-Friday 8:00-4:00, closed for lunch 1-2 pm.  No appointment needed.  [facility] locations:   [facility] - [address] - [address] ([facility]) - [address] [phone] [address]   [place] - [address]    [place]  - [address]    [place] - [address] - [address] [employer]  - [address] [place] - [address] Chief Technology Officer)  Testing/Procedures: Your physician has requested that you have an echocardiogram. Echocardiography is a painless test that uses sound waves to create images of your heart. It provides your doctor with information about the size and shape of your heart and how well your heart's chambers and valves are working. This procedure takes approximately one hour. There are no restrictions for this procedure. Please do NOT wear cologne, perfume, aftershave, or lotions (deodorant is allowed). Please arrive 15 minutes prior to your appointment time.  Follow-Up: At [facility], you and your health needs are our priority.  As part of our continuing mission to provide you with exceptional heart care, we have created designated Provider Care Teams.  These Care Teams include your primary Cardiologist (physician) and Advanced Practice Providers (APPs -  Physician Assistants and Nurse Practitioners) who all work together to provide you with the care you need, when you need it.  We recommend signing up for the patient portal called "MyChart".  Sign up information is provided on this After Visit Summary.  MyChart is used to connect with patients  for Virtual Visits (Telemedicine).  Patients are able to view lab/test results, encounter notes, upcoming appointments, etc.  Non-urgent messages can be sent to your provider as well.   To learn more about what you can do with MyChart, go to [email or web address].    Your next appointment:   2 month(s)  Provider:   Chrystie Nose, MD  or Azalee Course, PA-C

## 2022-08-21 LAB — BASIC METABOLIC PANEL
BUN/Creatinine Ratio: 14 (ref 12–28)
BUN: 8 mg/dL (ref 8–27)
CO2: 26 mmol/L (ref 20–29)
Calcium: 9.4 mg/dL (ref 8.7–10.3)
Chloride: 102 mmol/L (ref 96–106)
Creatinine, Ser: 0.56 mg/dL — ABNORMAL LOW (ref 0.57–1.00)
Glucose: 169 mg/dL — ABNORMAL HIGH (ref 70–99)
Potassium: 4.8 mmol/L (ref 3.5–5.2)
Sodium: 140 mmol/L (ref 134–144)
eGFR: 89 mL/min/{1.73_m2} (ref >59)

## 2022-08-21 LAB — BRAIN NATRIURETIC PEPTIDE: BNP: 169.1 pg/mL — ABNORMAL HIGH (ref 0.0–100.0)

## 2022-09-13 ENCOUNTER — Ambulatory Visit (HOSPITAL_COMMUNITY): Payer: Medicare Other | Attending: Internal Medicine

## 2022-09-13 DIAGNOSIS — R0602 Shortness of breath: Secondary | ICD-10-CM | POA: Insufficient documentation

## 2022-09-13 LAB — ECHOCARDIOGRAM COMPLETE
Area-P 1/2: 5.16 cm2
S' Lateral: 2.2 cm

## 2022-10-11 ENCOUNTER — Ambulatory Visit: Payer: Medicare Other | Attending: Physician Assistant | Admitting: Physician Assistant

## 2022-10-13 NOTE — Progress Notes (Signed)
This encounter was created in error - please disregard.

## 2022-10-20 NOTE — Progress Notes (Deleted)
Cardiology Clinic Note   Date: 10/20/2022 ID: Judy Wells, DOB [birth year], MRN [id number]  Primary Cardiologist:  Chrystie Nose, MD  Patient Profile    Judy Wells is a 88 y.o. female who presents to the clinic today for ***    Past medical history significant for: Chronic DOE.  Echo 09/13/2022: EF 65-70%, grade I DD, normal RV function, mildly elevated pulmonary artery systolic pressure, moderate LAE, aortic valve sclerosis/calcification without stenosis.  No change from prior echo January 2020. Wolff-Parkinson-White syndrome. S/p ablation 1997. GERD. T2DM. Hypothyroidism.     History of Present Illness    Judy Wells was briefly followed by Dr. Clarene Duke.  She had a heart catheterization in October 1998 which showed normal coronaries.  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 Graciela Husbands.  She reports she suffered cardiac arrest in [place] (where she is from).  She had an MI at that time but is uncertain of her cardiac history.  She had a negative stress test in 2013.  She was first evaluated by Dr. Rennis Golden on 07/28/2013 for shortness of breath.  She also reported mild tremors for which she was being seen by neurology.  Echo showed normal LV function, Grade I DD, mild MR/TR. patient continued to complain of shortness of breath at subsequent follow-up visits.  She was evaluated by Azalee Course, PA-C in the office on 05/07/2019 with complaints of chest pain.  Stat troponin was negative and patient was set up for 2-day nuclear stress test which showed medium sized, mild severity mostly fixed apical, inferior apical perfusion defect, suspect artifact and no reversible ischemia.  Patient continues to be followed for the above outlined history.  Patient was last seen in the office by Dr. Rennis Golden on 08/14/2022 for routine follow-up.  She reported more shortness of breath and PCP started her on Lasix which she had been taking 1-2 times a week at night.  This was interfering  with her sleep. She was instructed to increase Lasix and take it in the morning.  She underwent an echo which was unchanged from previous (detailed above).  Today, patient ***  Chronic DOE. Echo May 2024 normal LV/RV, mildly elevated pulmonary artery pressure, moderate LAE. Patient *** WPW. S/p ablation 1997. Patient ***   ROS: All other systems reviewed and are otherwise negative except as noted in History of Present Illness.  Studies Reviewed       ***  Risk Assessment/Calculations    {Does this patient have ATRIAL FIBRILLATION?:[id number]} No BP recorded.  {Refresh Note OR Click here to enter BP  :1}***        Physical Exam    VS:  There were no vitals taken for this visit. , BMI There is no height or weight on file to calculate BMI.  GEN: Well nourished, well developed, in no acute distress. Neck: No JVD or carotid bruits. Cardiac: *** RRR. No murmurs. No rubs or gallops.   Respiratory:  Respirations regular and unlabored. Clear to auscultation without rales, wheezing or rhonchi. GI: Soft, nontender, nondistended. Extremities: Radials/DP/PT 2+ and equal bilaterally. No clubbing or cyanosis. No edema ***  Skin: Warm and dry, no rash. Neuro: Strength intact.  Assessment & Plan   ***  Disposition: ***     {Are you ordering a CV Procedure (e.g. stress test, cath, DCCV, TEE, etc)?   Press F2        :[id number]}   Signed, Etta Grandchild. Kaliyan Osbourn, DNP, NP-C

## 2022-10-22 ENCOUNTER — Ambulatory Visit: Payer: Medicare Other | Admitting: Student

## 2022-10-22 NOTE — Progress Notes (Signed)
Cardiology Clinic Note   Patient Name: Judy Wells Date of Encounter: 10/26/2022  Primary Care Provider:  Tracey Harries, MD Primary Cardiologist:  Chrystie Nose, MD  Patient Profile     Judy Wells 88 year old female presents to the clinic today for follow-up evaluation of her [employer] and DOE.  Past Medical History    Past Medical History:  Diagnosis Date   Anemia    Coronary artery disease 1980's   Diabetes mellitus type II    Diverticulosis 2011   GERD (gastroesophageal reflux disease)    H. pylori infection 11/2011   Hiatal hernia    HTN (hypertension)    Hypothyroid    Myocardial infarction (HCC)    secondary to WPW   Osteoarthritis    Osteopenia    Vitamin D deficiency    Wolf-Parkinson-White syndrome 1999   s/p ablation in 1997 (Dr. Odessa Fleming) - after cardiac arrest while in [place]   Past Surgical History:  Procedure Laterality Date   ABDOMINAL ADHESION SURGERY     ABLATION  05/19/1997   Dr. Graciela Husbands - bidirectional conduction of right posterolateral AT - RF ablation   APPENDECTOMY     BREAST SURGERY     breast lump removed - tiny   CARDIAC CATHETERIZATION  02/19/1997   noraml L main, LAD free of disease, diagonal with no obstructions, L Cfx free of disease (along with OM & PDA being normal), RCA small & non-dominant & free of disease (Dr. Langston Reusing)   CATARACT EXTRACTION     COLONOSCOPY     NM MYOCAR PERF WALL MOTION  01/2012   lexiscan myoview - normal pattern of perfusion in all regions, EF 64%    Allergies  Allergies  Allergen Reactions   Atorvastatin Other (See Comments)    Pain and falls   Pravachol [Pravastatin Sodium] Itching    History of Present Illness     Judy Wells has a PMH of chronic anemia, DOE, tremor, right foot pain, weight gain, type 2 diabetes, hypothyroidism, GERD, and Wolff-Parkinson-White syndrome.  She was previously followed by Dr. Clarene Duke.  She is post ablation by Dr. Graciela Husbands for her WPW.  She  suffered a cardiac arrest in [place] which is where she is from.  She reported an MI at that time.  We do not have records of her cardiac history from the event.  She was seen in follow-up by Dr. Rennis Golden and reported chest discomfort that improved with drinking jasmine tea.  It was felt that her symptoms were related to GERD/reflux.  She underwent stress testing in 2013 which was negative for ischemia and showed preserved EF.  She was seen in follow-up 08/14/2022.  She reported some recent shortness of breath.  She had presented to her PCP.  She was prescribed furosemide.  She reported using the medication once or twice per week.  She was using the medication at night and it was interfering with her sleep.  Her echocardiogram from 2020 was reviewed which showed moderate diastolic dysfunction and left atrial enlargement.  She was noted to have chronic bronchitis and noted some improvement with her inhaler use.  She continued with nonproductive cough and was noted to have allergic type symptoms.  Her blood pressure was 100/57.  She was instructed to take her diuretic in the morning and her furosemide was also increased to 40 mg daily.  Her BMP showed stable creatinine and a potassium of 4.8.  Her BNP was elevated at  169.1.  Her echocardiogram showed an LVEF of 65-70%, G1 DD, moderately dilated left atria, trivial mitral valve regurgitation and mild calcification of the aortic valve there were no significant changes from her prior study.  She presents to the clinic today for follow-up evaluation and states she has had some increased stress related to her husband who has dementia.  She is his primary caregiver.  She also reports some frustration with her and tremor.  She continues to have slight shortness of breath which is related to her chronic bronchitis.  From a cardiac standpoint she remained stable.  I will order a BMP, continue her current medication regimen, give salty 6 diet sheet, and plan follow-up in 6  months..  Today she denies chest pain, increased shortness of breath, lower extremity edema, fatigue, palpitations, melena, hematuria, hemoptysis, diaphoresis, weakness, presyncope, syncope, orthopnea, and PND.    Home Medications    Prior to Admission medications   Medication Sig Start Date End Date Taking? Authorizing Provider  acetaminophen (TYLENOL) 650 MG CR tablet Take 650 mg by mouth every 8 (eight) hours as needed for pain.    [provider]  albuterol (VENTOLIN HFA) 108 (90 Base) MCG/ACT inhaler Inhale 2 puffs into the lungs every 6 (six) hours as needed. 01/26/20   Charlott Holler, MD  aspirin 81 MG tablet Take 81 mg by mouth daily.     [provider]  Calcium Carbonate-Vitamin D 600-400 MG-UNIT per tablet Take 1 tablet by mouth.    [provider]  cetirizine (ZYRTEC) 5 MG tablet Take by mouth. 04/17/16   [provider]  FLAXSEED, LINSEED, PO Take by mouth.    [provider]  furosemide (LASIX) 40 MG tablet Take 1 tablet (40 mg total) by mouth daily. 08/14/22   Hilty, Lisette Abu, MD  gabapentin (NEURONTIN) 100 MG capsule Take 1 capsule (100 mg total) by mouth 2 (two) times daily. 03/13/22   Azalee Course, PA  glipiZIDE (GLUCOTROL) 10 MG tablet Take 10 mg by mouth daily.    [provider]  IRON PO Take by mouth. Slow release    [provider]  levothyroxine (SYNTHROID, LEVOTHROID) 112 MCG tablet Take 100 mcg by mouth daily before breakfast.     [provider]  metFORMIN (GLUCOPHAGE) 1000 MG tablet Take 1,000 mg by mouth 2 (two) times daily with a meal.     [provider]  omeprazole (PRILOSEC) 40 MG capsule Take 40 mg by mouth daily. 12/07/19   [provider]  propranolol ER (INDERAL LA) 60 MG 24 hr capsule TAKE 1 CAPSULE BY MOUTH EVERY DAY 01/13/18   Hilty, Lisette Abu, MD    Family History    Family History  Problem Relation Age of Onset   Dementia Mother    Stroke Father    Stroke  Maternal Grandmother    Aneurysm Sister    Heart disease Cousin    Stroke Sister    She indicated that her mother is deceased. She indicated that her father is deceased. She indicated that two of her three sisters are alive. She indicated that her maternal grandmother is deceased. She indicated that her maternal grandfather is deceased. She indicated that her paternal grandmother is deceased. She indicated that her paternal grandfather is deceased. She indicated that the status of her cousin is unknown.  Social History    Social History   Socioeconomic History   Marital status: Married    Spouse name: Richard   Number  of children: 4   Years of education: 13   Highest education level: Not on file  Occupational History    Employer: [employer]  Tobacco Use   Smoking status: Never   Smokeless tobacco: Never  Vaping Use   Vaping Use: Never used  Substance and Sexual Activity   Alcohol use: Yes    Alcohol/week: 0.0 standard drinks of alcohol    Comment: Rare   Drug use: No   Sexual activity: Not Currently    Comment: 1st intercourse 88 yo-Fewer than 5 partners  Other Topics Concern   Not on file  Social History Narrative   Married, lives at home with husband   Caffeine use- coffee 3 cups daily   Social Determinants of Health   Financial Resource Strain: Not on file  Food Insecurity: Not on file  Transportation Needs: Not on file  Physical Activity: Not on file  Stress: Not on file  Social Connections: Not on file  Intimate Partner Violence: Not on file     Review of Systems    General:  No chills, fever, night sweats or weight changes.  Cardiovascular:  No chest pain, dyspnea on exertion, edema, orthopnea, palpitations, paroxysmal nocturnal dyspnea. Dermatological: No rash, lesions/masses Respiratory: No cough, dyspnea Urologic: No hematuria, dysuria Abdominal:   No nausea, vomiting, diarrhea, bright red blood per rectum, melena, or hematemesis Neurologic:  No visual  changes, wkns, changes in mental status. All other systems reviewed and are otherwise negative except as noted above.  Physical Exam    VS:  BP 126/84   Pulse 72   Ht 5\' 2"  (1.575 m)   Wt 213 lb 3.2 oz (96.7 kg)   SpO2 98%   BMI 38.99 kg/m  , BMI Body mass index is 38.99 kg/m. GEN: Well nourished, well developed, in no acute distress. HEENT: normal. Neck: Supple, no JVD, carotid bruits, or masses. Cardiac: RRR, no murmurs, rubs, or gallops. No clubbing, cyanosis, edema.  Radials/DP/PT 2+ and equal bilaterally.  Respiratory:  Respirations regular and unlabored, clear to auscultation bilaterally. GI: Soft, nontender, nondistended, BS + x 4. MS: no deformity or atrophy. Skin: warm and dry, no rash. Neuro:  Strength and sensation are intact. Psych: Normal affect.  Accessory Clinical Findings    Recent Labs: 08/20/2022: BNP 169.1; BUN 8; Creatinine, Ser 0.56; Potassium 4.8; Sodium 140   Recent Lipid Panel No results found for: "CHOL", "TRIG", "HDL", "CHOLHDL", "VLDL", "LDLCALC", "LDLDIRECT"       ECG personally reviewed by me today-none today.  Echocardiogram 09/13/2022  IMPRESSIONS   1. Left ventricular ejection fraction, by estimation, is 65 to 70%. The left ventricle has normal function. The left ventricle has no regional wall motion abnormalities. Left ventricular diastolic parameters are consistent with Grade I diastolic  dysfunction (impaired relaxation). 2. Right ventricular systolic function is normal. The right ventricular size is normal. There is mildly elevated pulmonary artery systolic pressure. The estimated right ventricular systolic pressure is 40.7 mmHg. 3. Left atrial size was moderately dilated. 4. The mitral valve is grossly normal. Trivial mitral valve regurgitation. 5. The aortic valve is tricuspid. There is mild calcification of the aortic valve. There is mild thickening of the aortic valve. Aortic valve regurgitation is not visualized. Aortic valve  sclerosis/calcification is present, without any evidence of  aortic stenosis. 6. The inferior vena cava is normal in size with greater than 50% respiratory variability, suggesting right atrial pressure of 3 mmHg.  Comparison(s): No significant change from prior study.  FINDINGS Left  Ventricle: Left ventricular ejection fraction, by estimation, is 65 to 70%. The left ventricle has normal function. The left ventricle has no regional wall motion abnormalities. 3D ejection fraction reviewed and evaluated as part of the  interpretation. Alternate measurement of EF is felt to be most reflective of LV function. The left ventricular internal cavity size was normal in size. There is no left ventricular hypertrophy. Left ventricular diastolic parameters are consistent with  Grade I diastolic dysfunction (impaired relaxation).  Right Ventricle: The right ventricular size is normal. No increase in right ventricular wall thickness. Right ventricular systolic function is normal. There is mildly elevated pulmonary artery systolic pressure. The tricuspid regurgitant velocity is 3.07 m/s, and with an assumed right atrial pressure of 3 mmHg, the estimated right ventricular systolic pressure is 40.7 mmHg.  Left Atrium: Left atrial size was moderately dilated.  Right Atrium: Right atrial size was normal in size.  Pericardium: There is no evidence of pericardial effusion.  Mitral Valve: The mitral valve is grossly normal. Trivial mitral valve regurgitation.  Tricuspid Valve: The tricuspid valve is normal in structure. Tricuspid valve regurgitation is mild.  Aortic Valve: The aortic valve is tricuspid. There is mild calcification of the aortic valve. There is mild thickening of the aortic valve. Aortic valve regurgitation is not visualized. Aortic valve sclerosis/calcification is present, without any  evidence of aortic stenosis.  Pulmonic Valve: The pulmonic valve was normal in structure. Pulmonic valve  regurgitation is trivial.  Aorta: The aortic root is normal in size and structure.  Venous: The inferior vena cava is normal in size with greater than 50% respiratory variability, suggesting right atrial pressure of 3 mmHg.  IAS/Shunts: The atrial septum is grossly normal.     Assessment & Plan   1.  Acute on chronic diastolic CHF-weight today 213.2.  Previous weight noted to be 215 pounds.  She was started on furosemide 40 mg daily during last visit.  Echocardiogram reassuring.  Details above. Continue furosemide  Heart healthy low-sodium diet Order BMP  Lower extremity edema-weight significantly improved.  Lower extremity generalized nonpitting edema Continue furosemide  Heart healthy low-sodium diet Elevate lower extremities when not active Encourage lower extremity support stockings  Shortness of breath, chronic bronchitis-significantly improved with increased diuresis. Continue inhaler use Follows with PCP  WPW-Hr today 72. Status post ablation 1997.    Disposition: Follow-up with Dr. Rennis Golden or me in 4-6 months.   Thomasene Ripple. Anny Sayler NP-C     10/26/2022, 11:56 AM  [facility] [address] Office [phone] Fax [phone]    I spent 14 minutes examining this patient, reviewing medications, and using patient centered shared decision making involving her cardiac care.  Prior to her visit I spent greater than 20 minutes reviewing her past medical history,  medications, and prior cardiac tests.

## 2022-10-26 ENCOUNTER — Encounter: Payer: Self-pay | Admitting: General Practice

## 2022-10-26 ENCOUNTER — Ambulatory Visit: Payer: Medicare Other | Attending: Physician Assistant | Admitting: General Practice

## 2022-10-26 VITALS — BP 126/84 | HR 72 | Ht 62.0 in | Wt 213.2 lb

## 2022-10-26 DIAGNOSIS — J42 Unspecified chronic bronchitis: Secondary | ICD-10-CM

## 2022-10-26 DIAGNOSIS — I5033 Acute on chronic diastolic (congestive) heart failure: Secondary | ICD-10-CM

## 2022-10-26 DIAGNOSIS — R600 Localized edema: Secondary | ICD-10-CM | POA: Diagnosis not present

## 2022-10-26 DIAGNOSIS — R0602 Shortness of breath: Secondary | ICD-10-CM

## 2022-10-26 DIAGNOSIS — I456 Pre-excitation syndrome: Secondary | ICD-10-CM

## 2022-10-26 NOTE — Patient Instructions (Signed)
Medication Instructions:  The current medical regimen is effective;  continue present plan and medications as directed. Please refer to the Current Medication list given to you today.  *If you need a refill on your cardiac medications before your next appointment, please call your pharmacy*   Lab Work: BMET If you have labs (blood work) drawn today and your tests are completely normal, you will receive your results only by: MyChart Message (if you have MyChart) OR  A paper copy in the mail If you have any lab test that is abnormal or we need to change your treatment, we will call you to review the results.  Testing/Procedures: NONE  Follow-Up: At [facility], you and your health needs are our priority.  As part of our continuing mission to provide you with exceptional heart care, we have created designated Provider Care Teams.  These Care Teams include your primary Cardiologist (physician) and Advanced Practice Providers (APPs -  Physician Assistants and Nurse Practitioners) who all work together to provide you with the care you need, when you need it.  Your next appointment:   4-6 month(s)  Provider:   Chrystie Nose, MD  or Edd Fabian, FNP        Other Instructions PLEASE READ AND FOLLOW ATTACHED  SALTY 6

## 2022-10-27 LAB — BASIC METABOLIC PANEL
BUN/Creatinine Ratio: 17 (ref 12–28)
BUN: 10 mg/dL (ref 8–27)
CO2: 25 mmol/L (ref 20–29)
Calcium: 9.1 mg/dL (ref 8.7–10.3)
Chloride: 102 mmol/L (ref 96–106)
Creatinine, Ser: 0.59 mg/dL (ref 0.57–1.00)
Glucose: 170 mg/dL — ABNORMAL HIGH (ref 70–99)
Potassium: 4.6 mmol/L (ref 3.5–5.2)
Sodium: 140 mmol/L (ref 134–144)
eGFR: 88 mL/min/{1.73_m2} (ref >59)

## 2023-04-01 ENCOUNTER — Ambulatory Visit: Payer: Medicare Other | Attending: Internal Medicine | Admitting: Internal Medicine

## 2023-04-02 ENCOUNTER — Encounter: Payer: Self-pay | Admitting: Internal Medicine

## 2023-04-25 ENCOUNTER — Ambulatory Visit: Payer: Medicare Other | Attending: Internal Medicine | Admitting: Internal Medicine

## 2023-04-25 ENCOUNTER — Ambulatory Visit (INDEPENDENT_AMBULATORY_CARE_PROVIDER_SITE_OTHER): Payer: Medicare Other

## 2023-04-25 ENCOUNTER — Encounter: Payer: Self-pay | Admitting: Internal Medicine

## 2023-04-25 VITALS — BP 126/72 | HR 63 | Ht 62.0 in | Wt 214.5 lb

## 2023-04-25 DIAGNOSIS — I10 Essential (primary) hypertension: Secondary | ICD-10-CM

## 2023-04-25 DIAGNOSIS — I499 Cardiac arrhythmia, unspecified: Secondary | ICD-10-CM

## 2023-04-25 DIAGNOSIS — I5032 Chronic diastolic (congestive) heart failure: Secondary | ICD-10-CM

## 2023-04-25 NOTE — Patient Instructions (Addendum)
 Medication Instructions:  No changes  *If you need a refill on your cardiac medications before your next appointment, please call your pharmacy*  Testing/Procedures: ZIO XT- Long Term Monitor Instructions  Your physician has requested you wear a ZIO patch monitor for 3 days.  This is a single patch monitor. [employer] supplies one patch monitor per enrollment. Additional stickers are not available. Please do not apply patch if you will be having a Nuclear Stress Test,  Echocardiogram, Cardiac CT, MRI, or Chest Xray during the period you would be wearing the  monitor. The patch cannot be worn during these tests. You cannot remove and re-apply the  ZIO XT patch monitor.  Your ZIO patch monitor will be mailed 3 day USPS to your address on file. It may take 3-5 days  to receive your monitor after you have been enrolled.  Once you have received your monitor, please review the enclosed instructions. Your monitor  has already been registered assigning a specific monitor serial # to you.  Billing and Patient Assistance Program Information  We have supplied [employer] with any of your insurance information on file for billing purposes. [employer] offers a sliding scale Patient Assistance Program for patients that do not have  insurance, or whose insurance does not completely cover the cost of the ZIO monitor.  You must apply for the Patient Assistance Program to qualify for this discounted rate.  To apply, please call [employer] at [phone], select option 4, select option 2, ask to apply for  Patient Assistance Program. Meredeth will ask your household income, and how many people  are in your household. They will quote your out-of-pocket cost based on that information.  [employer] will also be able to set up a 36-month, interest-free payment plan if needed.  Applying the monitor   Shave hair from upper left chest.  Hold abrader disc by orange tab. Rub abrader in 40 strokes over the upper left chest as   indicated in your monitor instructions.  Clean area with 4 enclosed alcohol pads. Let dry.  Apply patch as indicated in monitor instructions. Patch will be placed under collarbone on left  side of chest with arrow pointing upward.  Rub patch adhesive wings for 2 minutes. Remove white label marked 1. Remove the white  label marked 2. Rub patch adhesive wings for 2 additional minutes.  While looking in a mirror, press and release button in center of patch. A small green light will  flash 3-4 times. This will be your only indicator that the monitor has been turned on.  Do not shower for the first 24 hours. You may shower after the first 24 hours.  Press the button if you feel a symptom. You will hear a small click. Record Date, Time and  Symptom in the Patient Logbook.  When you are ready to remove the patch, follow instructions on the last 2 pages of Patient  Logbook. Stick patch monitor onto the last page of Patient Logbook.  Place Patient Logbook in the blue and white box. Use locking tab on box and tape box closed  securely. The blue and white box has prepaid postage on it. Please place it in the mailbox as  soon as possible. Your physician should have your test results approximately 7 days after the  monitor has been mailed back to [facility].  Call [facility] Customer Care at [phone] if you have questions regarding  your ZIO XT patch monitor. Call them immediately if you see an orange light  blinking on your  monitor.  If your monitor falls off in less than 4 days, contact our Monitor department at [phone].  If your monitor becomes loose or falls off after 4 days call [employer] at [phone] for  suggestions on securing your monitor    Follow-Up: At [facility], you and your health needs are our priority.  As part of our continuing mission to provide you with exceptional heart care, we have created designated Provider Care Teams.  These Care Teams  include your primary Cardiologist (physician) and Advanced Practice Providers (APPs -  Physician Assistants and Nurse Practitioners) who all work together to provide you with the care you need, when you need it.  We recommend signing up for the patient portal called MyChart.  Sign up information is provided on this After Visit Summary.  MyChart is used to connect with patients for Virtual Visits (Telemedicine).  Patients are able to view lab/test results, encounter notes, upcoming appointments, etc.  Non-urgent messages can be sent to your provider as well.   To learn more about what you can do with MyChart, go to [email or web address].    Your next appointment:   4-6 weeks with Dr. Mona or Josefa Beauvais NP
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
## 2023-04-25 NOTE — Progress Notes (Unsigned)
 Enrolled patient for a 3 day Zio XT monitor to be mailed to patients home

## 2023-04-30 ENCOUNTER — Telehealth: Payer: Self-pay | Admitting: Internal Medicine

## 2023-04-30 ENCOUNTER — Ambulatory Visit: Payer: Medicare Other | Attending: Internal Medicine

## 2023-04-30 DIAGNOSIS — I499 Cardiac arrhythmia, unspecified: Secondary | ICD-10-CM

## 2023-04-30 NOTE — Progress Notes (Unsigned)
 Enrolled patient for a 3 day Zio XT monitor to be mailed to patients home

## 2023-04-30 NOTE — Telephone Encounter (Signed)
 Patient identification verified by 2 forms. Shirl Ellen, RN     Called and spoke to patient  Patient states:  - monitor continues to blink orange even after trouble shooting device with iRHYTM. Tech support instructed patient to send current device back to company.  - Patient has not worn device for requested time of 3 days. Patient just put device on today 04/30/23              Interventions/Plan: -order placed for new monitor. Encounter routed to primary cardiologist.   Patient agrees with plan, no questions at this time

## 2023-04-30 NOTE — Telephone Encounter (Signed)
 Pt called in with the pt, she states that the heart monitor is not working. They called the company to fix it but it is still blinking orange. She states they told her another monitor would need to be ordered.

## 2023-05-08 DIAGNOSIS — I4719 Other supraventricular tachycardia: Secondary | ICD-10-CM | POA: Diagnosis not present

## 2023-05-08 DIAGNOSIS — I499 Cardiac arrhythmia, unspecified: Secondary | ICD-10-CM | POA: Diagnosis not present

## 2023-06-05 NOTE — Progress Notes (Unsigned)
Cardiology Clinic Note   Patient Name: Judy Wells Date of Encounter: 06/06/2023  Primary Care Provider:  Tracey Harries, MD Primary Cardiologist:  Chrystie Nose, MD  Patient Profile     Judy Wells 89 year old female presents to the clinic today for follow-up evaluation of her heart rate and to review her cardiac event monitor.  Past Medical History    Past Medical History:  Diagnosis Date   Anemia    Coronary artery disease 1980's   Diabetes mellitus type II    Diverticulosis 2011   GERD (gastroesophageal reflux disease)    H. pylori infection 11/2011   Hiatal hernia    HTN (hypertension)    Hypothyroid    Myocardial infarction (HCC)    secondary to WPW   Osteoarthritis    Osteopenia    Vitamin D deficiency    Wolf-Parkinson-White syndrome 1999   s/p ablation in 1997 (Dr. Odessa Fleming) - after cardiac arrest while in [place]   Past Surgical History:  Procedure Laterality Date   ABDOMINAL ADHESION SURGERY     ABLATION  05/19/1997   Dr. Graciela Husbands - bidirectional conduction of right posterolateral AT - RF ablation   APPENDECTOMY     BREAST SURGERY     breast lump removed - tiny   CARDIAC CATHETERIZATION  02/19/1997   noraml L main, LAD free of disease, diagonal with no obstructions, L Cfx free of disease (along with OM & PDA being normal), RCA small & non-dominant & free of disease (Dr. Langston Reusing)   CATARACT EXTRACTION     COLONOSCOPY     NM MYOCAR PERF WALL MOTION  01/2012   lexiscan myoview - normal pattern of perfusion in all regions, EF 64%    Allergies  Allergies  Allergen Reactions   Atorvastatin Other (See Comments)    Pain and falls   Pravachol [Pravastatin Sodium] Itching    History of Present Illness     Judy Wells has a PMH of chronic anemia, DOE, tremor, right foot pain, weight gain, type 2 diabetes, hypothyroidism, GERD, and Wolff-Parkinson-White syndrome.  She was previously followed by Dr. Clarene Duke.  She is post ablation by Dr.  Graciela Husbands for her WPW.  She suffered a cardiac arrest in [place] which is where she is from.  She reported an MI at that time.  We do not have records of her cardiac history from the event.  She was seen in follow-up by Dr. Rennis Golden and reported chest discomfort that improved with drinking jasmine tea.  It was felt that her symptoms were related to GERD/reflux.  She underwent stress testing in 2013 which was negative for ischemia and showed preserved EF.  She was seen in follow-up 08/14/2022.  She reported some recent shortness of breath.  She had presented to her PCP.  She was prescribed furosemide.  She reported using the medication once or twice per week.  She was using the medication at night and it was interfering with her sleep.  Her echocardiogram from 2020 was reviewed which showed moderate diastolic dysfunction and left atrial enlargement.  She was noted to have chronic bronchitis and noted some improvement with her inhaler use.  She continued with nonproductive cough and was noted to have allergic type symptoms.  Her blood pressure was 100/57.  She was instructed to take her diuretic in the morning and her furosemide was also increased to 40 mg daily.  Her BMP showed stable creatinine and a potassium of 4.8.  Her  BNP was elevated at 169.1.  Her echocardiogram showed an LVEF of 65-70%, G1 DD, moderately dilated left atria, trivial mitral valve regurgitation and mild calcification of the aortic valve there were no significant changes from her prior study.  She presented to the clinic 10/26/22 for follow-up evaluation and stated she had some increased stress related to her husband who has dementia.  She is his primary caregiver.  She also reported some frustration with her hand tremor.  She continued to have slight shortness of breath which was related to her chronic bronchitis.  From a cardiac standpoint she remained stable.  I ordered a BMP, continue her current medication regimen, gave salty 6 diet sheet, and  planned follow-up in 6 months.  She was seen in follow-up by Dr. Rennis Golden on 04/25/2023.  She was feeling well.  She continued to take her furosemide daily.  Her EKG showed an irregularly irregular rhythm.  She was placed on a 72-hour cardiac event monitor.  The recommendation for starting anticoagulation with Eliquis 5 mg twice daily if atrial fibrillation present was made.  If Eliquis was needed it was felt that her aspirin could be stopped.  Her chads Vascor was noted to be 4.  Follow-up was planned for 2 to 3 months.  Cardiac event monitor resulted on 05/31/2023.  She was noted to have some PSVT and possible NSVT or SVT.  No atrial fibrillation was noted.  She was continued on her medication regimen.  She presents to the clinic today for follow-up evaluation and states she does note some increased work of breathing with increased physical activity.  She presents to the clinic with her daughter today.  We reviewed her cardiac event monitor.  She expressed understanding.  She denies episodes of irregular heart rate.  She has been previously seen by pulmonology who prescribed albuterol inhaler.  She has not seen pulmonology in 2 years.  I recommended that she follow-up with them and have her prescription refilled.  She does admit to some dietary indiscretion.  I asked her to increase her physical activity as tolerated and will give her the salty 6 diet sheet.  We will plan follow-up in 6 months.  Today she denies chest pain, increased shortness of breath, lower extremity edema, fatigue, palpitations, melena, hematuria, hemoptysis, diaphoresis, weakness, presyncope, syncope, orthopnea, and PND.    Home Medications    Prior to Admission medications   Medication Sig Start Date End Date Taking? Authorizing Provider  acetaminophen (TYLENOL) 650 MG CR tablet Take 650 mg by mouth every 8 (eight) hours as needed for pain.    [provider]  albuterol (VENTOLIN HFA) 108 (90 Base) MCG/ACT inhaler Inhale 2  puffs into the lungs every 6 (six) hours as needed. 01/26/20   Charlott Holler, MD  aspirin 81 MG tablet Take 81 mg by mouth daily.     [provider]  Calcium Carbonate-Vitamin D 600-400 MG-UNIT per tablet Take 1 tablet by mouth.    [provider]  cetirizine (ZYRTEC) 5 MG tablet Take by mouth. 04/17/16   [provider]  FLAXSEED, LINSEED, PO Take by mouth.    [provider]  furosemide (LASIX) 40 MG tablet Take 1 tablet (40 mg total) by mouth daily. 08/14/22   Hilty, Lisette Abu, MD  gabapentin (NEURONTIN) 100 MG capsule Take 1 capsule (100 mg total) by mouth 2 (two) times daily. 03/13/22   Azalee Course, PA  glipiZIDE (GLUCOTROL) 10 MG tablet Take 10 mg by mouth daily.  [provider]  IRON PO Take by mouth. Slow release    [provider]  levothyroxine (SYNTHROID, LEVOTHROID) 112 MCG tablet Take 100 mcg by mouth daily before breakfast.     [provider]  metFORMIN (GLUCOPHAGE) 1000 MG tablet Take 1,000 mg by mouth 2 (two) times daily with a meal.     [provider]  omeprazole (PRILOSEC) 40 MG capsule Take 40 mg by mouth daily. 12/07/19   [provider]  propranolol ER (INDERAL LA) 60 MG 24 hr capsule TAKE 1 CAPSULE BY MOUTH EVERY DAY 01/13/18   Hilty, Lisette Abu, MD    Family History    Family History  Problem Relation Age of Onset   Dementia Mother    Stroke Father    Stroke Maternal Grandmother    Aneurysm Sister    Heart disease Cousin    Stroke Sister    She indicated that her mother is deceased. She indicated that her father is deceased. She indicated that two of her three sisters are alive. She indicated that her maternal grandmother is deceased. She indicated that her maternal grandfather is deceased. She indicated that her paternal grandmother is deceased. She indicated that her paternal grandfather is deceased. She indicated that the status of her cousin is unknown.  Social History    Social  History   Socioeconomic History   Marital status: Married    Spouse name: Richard   Number of children: 4   Years of education: 13   Highest education level: Not on file  Occupational History    Employer: [employer]  Tobacco Use   Smoking status: Never   Smokeless tobacco: Never  Vaping Use   Vaping status: Never Used  Substance and Sexual Activity   Alcohol use: Yes    Alcohol/week: 0.0 standard drinks of alcohol    Comment: Rare   Drug use: No   Sexual activity: Not Currently    Comment: 1st intercourse 89 yo-Fewer than 5 partners  Other Topics Concern   Not on file  Social History Narrative   Married, lives at home with husband   Caffeine use- coffee 3 cups daily   Social Drivers of Health   Financial Resource Strain: Low Risk  (05/02/2023)   Received from [facility]   Overall Financial Resource Strain (CARDIA)    Difficulty of Paying Living Expenses: Not hard at all  Food Insecurity: No Food Insecurity (05/02/2023)   [facility]   Hunger Vital Sign    Worried About Running Out of Food in the Last Year: Never true    Ran Out of Food in the Last Year: Never true  Transportation Needs: No Transportation Needs (05/02/2023)   Received from [facility] - Transportation    Lack of Transportation (Medical): No    Lack of Transportation (Non-Medical): No  Physical Activity: Unknown (05/02/2023)   Received from [facility]   Exercise Vital Sign    Days of Exercise per Week: 0 days    Minutes of Exercise per Session: Not on file  Stress: No Stress Concern Present (05/02/2023)   Received from [facility] - Occupational Stress Questionnaire    Feeling of Stress : Not at all  Social Connections: Socially Integrated (05/02/2023)   Received from [facility]   Social Network    How would you rate your social network (family, work, friends)?: Good participation with social networks  Intimate Partner Violence:  Not At Risk (05/02/2023)   Received from [facility]   HITS    Over the last 12 months how often did your partner physically hurt you?: Never    Over the last 12 months how often did your partner insult you or talk down to you?: Never    Over the last 12 months how often did your partner threaten you with physical harm?: Never    Over the last 12 months how often did your partner scream or curse at you?: Never     Review of Systems    General:  No chills, fever, night sweats or weight changes.  Cardiovascular:  No chest pain, dyspnea on exertion, edema, orthopnea, palpitations, paroxysmal nocturnal dyspnea. Dermatological: No rash, lesions/masses Respiratory: No cough, dyspnea Urologic: No hematuria, dysuria Abdominal:   No nausea, vomiting, diarrhea, bright red blood per rectum, melena, or hematemesis Neurologic:  No visual changes, wkns, changes in mental status. All other systems reviewed and are otherwise negative except as noted above.  Physical Exam    VS:  BP (!) 124/54 (BP Location: Left Arm, Patient Position: Sitting, Cuff Size: Large)   Pulse 66   Ht 5\' 2"  (1.575 m)   Wt 215 lb 9.6 oz (97.8 kg)   SpO2 97%   BMI 39.43 kg/m  , BMI Body mass index is 39.43 kg/m. GEN: Well nourished, well developed, in no acute distress. HEENT: normal. Neck: Supple, no JVD, carotid bruits, or masses. Cardiac: RRR, no murmurs, rubs, or gallops. No clubbing, cyanosis, edema.  Radials/DP/PT 2+ and equal bilaterally.  Respiratory:  Respirations regular and unlabored, clear to auscultation bilaterally. GI: Soft, nontender, nondistended, BS + x 4. MS: no deformity or atrophy. Skin: warm and dry, no rash. Neuro:  Strength and sensation are intact. Psych: Normal affect.  Accessory Clinical Findings    Recent Labs: 08/20/2022: BNP 169.1 10/26/2022: BUN 10; Creatinine, Ser 0.59; Potassium 4.6; Sodium 140   Recent Lipid Panel No results found for: "CHOL", "TRIG", "HDL", "CHOLHDL", "VLDL",  "LDLCALC", "LDLDIRECT"       ECG personally reviewed by me today-none today.  Echocardiogram 09/13/2022  IMPRESSIONS   1. Left ventricular ejection fraction, by estimation, is 65 to 70%. The left ventricle has normal function. The left ventricle has no regional wall motion abnormalities. Left ventricular diastolic parameters are consistent with Grade I diastolic  dysfunction (impaired relaxation). 2. Right ventricular systolic function is normal. The right ventricular size is normal. There is mildly elevated pulmonary artery systolic pressure. The estimated right ventricular systolic pressure is 40.7 mmHg. 3. Left atrial size was moderately dilated. 4. The mitral valve is grossly normal. Trivial mitral valve regurgitation. 5. The aortic valve is tricuspid. There is mild calcification of the aortic valve. There is mild thickening of the aortic valve. Aortic valve regurgitation is not visualized. Aortic valve sclerosis/calcification is present, without any evidence of  aortic stenosis. 6. The inferior vena cava is normal in size with greater than 50% respiratory variability, suggesting right atrial pressure of 3 mmHg.  Comparison(s): No significant change from prior study.  FINDINGS Left Ventricle: Left ventricular ejection fraction, by estimation, is 65 to 70%. The left ventricle has normal function. The left ventricle has no regional wall motion abnormalities. 3D ejection fraction reviewed and evaluated as part of the  interpretation. Alternate measurement of EF is felt to be most reflective of LV function. The left ventricular internal cavity size was normal in size. There is no left ventricular hypertrophy. Left ventricular diastolic parameters are  consistent with  Grade I diastolic dysfunction (impaired relaxation).  Right Ventricle: The right ventricular size is normal. No increase in right ventricular wall thickness. Right ventricular systolic function is normal. There is mildly  elevated pulmonary artery systolic pressure. The tricuspid regurgitant velocity is 3.07 m/s, and with an assumed right atrial pressure of 3 mmHg, the estimated right ventricular systolic pressure is 40.7 mmHg.  Left Atrium: Left atrial size was moderately dilated.  Right Atrium: Right atrial size was normal in size.  Pericardium: There is no evidence of pericardial effusion.  Mitral Valve: The mitral valve is grossly normal. Trivial mitral valve regurgitation.  Tricuspid Valve: The tricuspid valve is normal in structure. Tricuspid valve regurgitation is mild.  Aortic Valve: The aortic valve is tricuspid. There is mild calcification of the aortic valve. There is mild thickening of the aortic valve. Aortic valve regurgitation is not visualized. Aortic valve sclerosis/calcification is present, without any  evidence of aortic stenosis.  Pulmonic Valve: The pulmonic valve was normal in structure. Pulmonic valve regurgitation is trivial.  Aorta: The aortic root is normal in size and structure.  Venous: The inferior vena cava is normal in size with greater than 50% respiratory variability, suggesting right atrial pressure of 3 mmHg.  IAS/Shunts: The atrial septum is grossly normal.  Cardiac event monitor 04/25/23  Patch Wear Time:  2 days and 17 hours (2025-01-15T17:19:18-0500 to 2025-01-18T10:20:09-498)   Patient had a min HR of 52 bpm, max HR of 150 bpm, and avg HR of 68 bpm. Predominant underlying rhythm was Sinus Rhythm. 1 run of Ventricular Tachycardia occurred lasting 10 beats with a max rate of 136 bpm (avg 125 bpm). 9 Supraventricular Tachycardia  runs occurred, the run with the fastest interval lasting 4 beats with a max rate of 150 bpm, the longest lasting 16 beats with an avg rate of 92 bpm. Some episodes of Supraventricular Tachycardia may be possible Atrial Tachycardia with variable block.  Isolated SVEs were occasional (1.6%, 4141), SVE Couplets were rare (<1.0%, 354), and SVE  Triplets were rare (<1.0%, 68). Isolated VEs were rare (<1.0%, 1130), VE Couplets were rare (<1.0%, 2), and VE Triplets were rare (<1.0%, 1). Difficulty discerning atrial activity making definitive diagnosis difficult to ascertain.   Monitor showed sinu rhythm with a 10 beat run of possible NSVT or SVT with aberrancy. Other short runs of SVT were noted. Low burden of isolated PAC's and PVC's - no atrial fibrillation. There were no patient triggered events. Would continue with current medications.  Assessment & Plan   1.   Palpitations, PACs-heart rate today 66 BPM.  Denies recent episodes of accelerated or irregular heartbeat.  Wore a cardiac event monitor which showed NSVT/SVT and low burden of isolated PACs and PVCs.  No atrial fibrillation was noted. Avoid triggers caffeine, chocolate, EtOH, dehydration etc.  Acute on chronic diastolic CHF-weight today 215 lbs.    Echocardiogram 09/13/2022 showed an LVEF of 65 to 70%, G1 DD, moderately dilated left atria and no significant valvular abnormalities.  Reassuring.  Details above. Continue furosemide  Heart healthy low-sodium diet-reviewed   Shortness of breath, chronic bronchitis-breathing at baseline.  Does note relief with albuterol inhaler. Continue current medical therapy Following with PCP, pulmonology Recommend follow-up appointment  Lower extremity edema-  Lower extremity generalized nonpitting edema, stable.  Weight stable. Continue furosemide  Heart healthy low-sodium diet Elevate lower extremities when not active-reviewed Encourage lower extremity support stockings-[employer] support stocking sheet given  WPW-heart rate today 66.  She is status post  ablation in 1997. Continue to monitor    Disposition: Follow-up with Dr. Rennis Golden or me in 6 months.   Thomasene Ripple. Sky Borboa NP-C     06/06/2023, 9:38 AM [facility] [address] Office [phone] Fax [phone]    I spent 14 minutes  examining this patient, reviewing medications, and using patient centered shared decision making involving her cardiac care.  I spent 20 minutes reviewing her past medical history,  medications, and prior cardiac tests.

## 2023-06-06 ENCOUNTER — Encounter: Payer: Self-pay | Admitting: General Practice

## 2023-06-06 ENCOUNTER — Ambulatory Visit: Payer: Medicare Other | Attending: General Practice | Admitting: General Practice

## 2023-06-06 VITALS — BP 124/54 | HR 66 | Ht 62.0 in | Wt 215.6 lb

## 2023-06-06 DIAGNOSIS — R0609 Other forms of dyspnea: Secondary | ICD-10-CM

## 2023-06-06 DIAGNOSIS — I491 Atrial premature depolarization: Secondary | ICD-10-CM

## 2023-06-06 DIAGNOSIS — I456 Pre-excitation syndrome: Secondary | ICD-10-CM | POA: Diagnosis not present

## 2023-06-06 DIAGNOSIS — R600 Localized edema: Secondary | ICD-10-CM

## 2023-06-06 DIAGNOSIS — I5033 Acute on chronic diastolic (congestive) heart failure: Secondary | ICD-10-CM

## 2023-06-06 NOTE — Patient Instructions (Signed)
Medication Instructions:  The current medical regimen is effective;  continue present plan and medications as directed. Please refer to the Current Medication list given to you today.  *If you need a refill on your cardiac medications before your next appointment, please call your pharmacy*  Lab Work: NONE  Other Instructions INCREASE PHYSICAL ACTIVITY AS TOLERATED PLEASE READ AND FOLLOW ATTACHED  SALTY 6   Follow-Up: At [facility], you and your health needs are our priority.  As part of our continuing mission to provide you with exceptional heart care, we have created designated Provider Care Teams.  These Care Teams include your primary Cardiologist (physician) and Advanced Practice Providers (APPs -  Physician Assistants and Nurse Practitioners) who all work together to provide you with the care you need, when you need it.  We recommend signing up for the patient portal called "MyChart".  Sign up information is provided on this After Visit Summary.  MyChart is used to connect with patients for Virtual Visits (Telemedicine).  Patients are able to view lab/test results, encounter notes, upcoming appointments, etc.  Non-urgent messages can be sent to your provider as well.   To learn more about what you can do with MyChart, go to [email or web address].    Your next appointment:   6 month(s)  Provider:   Chrystie Nose, MD  or Edd Fabian, FNP

## 2023-07-21 ENCOUNTER — Emergency Department (HOSPITAL_BASED_OUTPATIENT_CLINIC_OR_DEPARTMENT_OTHER): Admitting: Radiology

## 2023-07-21 ENCOUNTER — Other Ambulatory Visit: Payer: Self-pay

## 2023-07-21 ENCOUNTER — Emergency Department (HOSPITAL_BASED_OUTPATIENT_CLINIC_OR_DEPARTMENT_OTHER)
Admission: EM | Admit: 2023-07-21 | Discharge: 2023-07-21 | Disposition: A | Discharge status: Home / Self Care | Attending: Emergency Medicine | Admitting: Emergency Medicine

## 2023-07-21 ENCOUNTER — Encounter (HOSPITAL_BASED_OUTPATIENT_CLINIC_OR_DEPARTMENT_OTHER): Payer: Self-pay

## 2023-07-21 DIAGNOSIS — Z7982 Long term (current) use of aspirin: Secondary | ICD-10-CM | POA: Diagnosis not present

## 2023-07-21 DIAGNOSIS — M25562 Pain in left knee: Secondary | ICD-10-CM | POA: Insufficient documentation

## 2023-07-21 DIAGNOSIS — G8929 Other chronic pain: Secondary | ICD-10-CM | POA: Diagnosis not present

## 2023-07-21 DIAGNOSIS — Z7984 Long term (current) use of oral hypoglycemic drugs: Secondary | ICD-10-CM | POA: Insufficient documentation

## 2023-07-21 MED ORDER — NAPROXEN 250 MG PO TABS
500.0000 mg | ORAL_TABLET | Freq: Once | ORAL | Status: AC
  Administered 2023-07-21: 500 mg via ORAL
  Filled 2023-07-21: qty 2

## 2023-07-21 MED ORDER — NAPROXEN 375 MG PO TABS: 375.0000 mg | ORAL_TABLET | Freq: Two times a day (BID) | ORAL | 0 refills | Status: AC

## 2023-07-21 NOTE — ED Triage Notes (Signed)
 [place] [facility] [place] with complaints of left knee pain. Patient reports attempting to stand and feeling her left knee "lock". Now having increased pain. Uses a cane at baseline.

## 2023-07-21 NOTE — ED Notes (Signed)
 Right knee sleeve applied, pt asking for pain medicine before she is discharged. MD aware.

## 2023-07-21 NOTE — Discharge Instructions (Addendum)
 It was a pleasure taking part in your care.  As discussed, your exam and just show osteoarthritis of the left knee.  Please begin taking naproxen twice a day as needed for pain your left knee.  Please continue applying Salonpas patches.  Please follow-up with [facility] orthopedics.  Return to the ED with any new or worsening symptoms.  You may remove the knee brace at night to sleep, to shower during the day.

## 2023-07-21 NOTE — ED Provider Notes (Signed)
 [place] EMERGENCY DEPARTMENT AT [facility] Provider Note   CSN: [id number] Arrival date & time: 07/21/23  1329     History  Chief Complaint  Patient presents with   Knee Pain    left    Judy Wells is a 89 y.o. female with medical history of chronic left knee pain.  She reports to the ED for evaluation of left knee pain.  Reports that he was at church earlier today when she was walking back to the car, her left knee "gave out" and she fell to the floor.  Denies any preceding chest pain or shortness of breath.  States that her left knee pain just became painful causing her to lower self to the floor.  She denies hitting her head or losing consciousness during this event.  Denies preceding chest pain or shortness of breath.  States that her left knee pain feels very similarly.  Reports that she typically gets steroid injections into her left knee but has not done so in quite some time.  She was set to do this in January but due to issues with refrigeration at [facility] she was not able to do so.  States that she has appointment on April 10 to follow-up.  Denies any redness or swelling or fevers.   Knee Pain      Home Medications Prior to Admission medications   Medication Sig Start Date End Date Taking? Authorizing Provider  naproxen (NAPROSYN) 375 MG tablet Take 1 tablet (375 mg total) by mouth 2 (two) times daily. 07/21/23  Yes Al Decant, PA-C  acetaminophen (TYLENOL) 650 MG CR tablet Take 650 mg by mouth every 8 (eight) hours as needed for pain.    [provider]  albuterol (VENTOLIN HFA) 108 (90 Base) MCG/ACT inhaler Inhale 2 puffs into the lungs every 6 (six) hours as needed. 01/26/20   Charlott Holler, MD  aspirin 81 MG tablet Take 81 mg by mouth daily.     [provider]  Calcium Carbonate-Vitamin D 600-400 MG-UNIT per tablet Take 1 tablet by mouth.    [provider]  cetirizine (ZYRTEC) 5 MG tablet Take by mouth.  04/17/16   [provider]  FLAXSEED, LINSEED, PO Take by mouth.    [provider]  furosemide (LASIX) 40 MG tablet Take 1 tablet (40 mg total) by mouth daily. 08/14/22   Hilty, Lisette Abu, MD  gabapentin (NEURONTIN) 100 MG capsule Take 1 capsule (100 mg total) by mouth 2 (two) times daily. 03/13/22   Azalee Course, PA  glipiZIDE (GLUCOTROL) 10 MG tablet Take 10 mg by mouth daily.    [provider]  IRON PO Take by mouth. Slow release    [provider]  levothyroxine (SYNTHROID, LEVOTHROID) 112 MCG tablet Take 100 mcg by mouth daily before breakfast.     [provider]  metFORMIN (GLUCOPHAGE) 1000 MG tablet Take 1,000 mg by mouth 2 (two) times daily with a meal.     [provider]  omeprazole (PRILOSEC) 40 MG capsule Take 40 mg by mouth daily. 12/07/19   [provider]  propranolol ER (INDERAL LA) 60 MG 24 hr capsule TAKE 1 CAPSULE BY MOUTH EVERY DAY 01/13/18   Hilty, Lisette Abu, MD      Allergies    Atorvastatin and Pravachol [pravastatin sodium]    Review of Systems   Review of Systems  Musculoskeletal:  Positive for arthralgias.  All other systems reviewed and are negative.  Physical Exam Updated Vital Signs BP (!) 122/56   Pulse (!) 57   Temp 98.2 F (36.8 C) (Oral)   Resp 20   Ht 5\' 2"  (1.575 m)   Wt 97.8 kg   SpO2 94%   BMI 39.44 kg/m  Physical Exam Vitals and nursing note reviewed.  Constitutional:      General: She is not in acute distress.    Appearance: She is well-developed.  HENT:     Head: Normocephalic and atraumatic.  Eyes:     Conjunctiva/sclera: Conjunctivae normal.  Cardiovascular:     Rate and Rhythm: Normal rate and regular rhythm.     Heart sounds: No murmur heard. Pulmonary:     Effort: Pulmonary effort is normal. No respiratory distress.     Breath sounds: Normal breath sounds.  Abdominal:     Palpations: Abdomen is soft.     Tenderness: There is no abdominal tenderness.   Musculoskeletal:        General: No swelling.     Cervical back: Neck supple.     Comments: Slight soft tissue swelling to left knee.  No overlying skin change, redness or warmth.  She has intact range of motion passively and actively.  She has pain to the lateral portion of her left knee.  Skin:    General: Skin is warm and dry.     Capillary Refill: Capillary refill takes less than 2 seconds.  Neurological:     Mental Status: She is alert.  Psychiatric:        Mood and Affect: Mood normal.     ED Results / Procedures / Treatments   Labs (all labs ordered are listed, but only abnormal results are displayed) Labs Reviewed - No data to display  EKG None  Radiology DG Knee Complete 4 Views Left Result Date: 07/21/2023 CLINICAL DATA:  Left knee pain EXAM: LEFT KNEE - COMPLETE 4+ VIEW COMPARISON:  None Available. FINDINGS: No evidence of fracture or malalignment. Severe tricompartmental osteoarthritis. Chondrocalcinosis. Small-moderate knee joint effusion. No focal soft tissue swelling. 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 No acute findings. Electronically Signed   By: Duanne Guess D.O.   On: 07/21/2023 13:56    Procedures Procedures   Medications Ordered in ED Medications - No data to display  ED Course/ Medical Decision Making/ A&P   Medical Decision Making Amount and/or Complexity of Data Reviewed Radiology: ordered.   89 year old female with chronic left knee pain presents for left knee pain.  Please see HPI.  Patient left knee without overlying skin change, warmth, erythema.  She has intact range of motion actively and passively to her left knee.  There is pain and tenderness to the lateral portion.  Negative Lachman sign.  Will collect x-ray imaging and reassess.  X-ray merging shows severe tricompartmental osteoarthritis of the left knee.  No acute findings.  Patient advised of findings.  States she will follow-up with her  orthopedic doctor.  Will be sent home with naproxen.  Advised to purchase Salonpas patches.  Stable to discharge.   Final Clinical Impression(s) / ED Diagnoses Final diagnoses:  Chronic pain of left knee    Rx / DC Orders ED Discharge Orders          Ordered    naproxen (NAPROSYN) 375 MG tablet  2 times daily        07/21/23 1449              Al Decant, [place] 07/21/23  1450    Tegeler, Canary Brim, MD 07/21/23 [phone]

## 2023-08-08 ENCOUNTER — Encounter: Payer: Self-pay | Admitting: Internal Medicine

## 2023-08-08 ENCOUNTER — Ambulatory Visit: Payer: Medicare Other | Admitting: Internal Medicine

## 2023-08-08 VITALS — BP 124/66 | HR 54 | Ht 62.0 in | Wt 210.0 lb

## 2023-08-08 DIAGNOSIS — R0602 Shortness of breath: Secondary | ICD-10-CM

## 2023-08-08 DIAGNOSIS — J301 Allergic rhinitis due to pollen: Secondary | ICD-10-CM | POA: Diagnosis not present

## 2023-08-08 DIAGNOSIS — G4733 Obstructive sleep apnea (adult) (pediatric): Secondary | ICD-10-CM | POA: Diagnosis not present

## 2023-08-08 MED ORDER — ALBUTEROL SULFATE HFA 108 (90 BASE) MCG/ACT IN AERS: 2.0000 | INHALATION_SPRAY | Freq: Four times a day (QID) | RESPIRATORY_TRACT | 11 refills | Status: AC | PRN

## 2023-08-08 NOTE — Patient Instructions (Addendum)
 It was a pleasure to see you today!  Please schedule follow up with myself in 1 year.  If my schedule is not open yet, we will contact you with a reminder closer to that time. Please call [phone] if you haven't heard from us  a month before, and always call us  sooner if issues or concerns arise. You can also send us  a message through MyChart, but but aware that this is not to be used for urgent issues and it may take up to 5-7 days to receive a reply. Please be aware that you will likely be able to view your results before I have a chance to respond to them. Please give us  5 business days to respond to any non-urgent results.   YOUR PLAN:  -SHORTNESS OF BREATH: Your breathing testing was reassuring in 2021. If the albuterol inhaler helps you, you should continue using it as needed. We have refilled your prescription and scheduled your annual follow-up. Please contact us  if your breathing worsens or you are taking it more frequently or getting recurrent bronchitis.   -ALLERGIC RHINITIS: Allergic rhinitis is an allergic reaction that causes sneezing, congestion, and a runny nose. Your allergies are well-controlled with Zyrtec, so continue taking it as needed.

## 2023-08-08 NOTE — Progress Notes (Signed)
 Judy Wells    [id number]    11-May-1934  Primary Care Physician:Bouska, Onalee Hua, MD  Referring Physician: Tracey Harries, MD [address] [place],  [place] [address] Reason for Consultation: shortness of breath Date of Consultation: 08/08/2023  Chief complaint:   Chief Complaint  Patient presents with   Consult    Patient states she has chronic bronchitis and is experiencing sob on exertion.     HPI: Discussed the use of AI scribe software for clinical note transcription with the patient, who gave verbal consent to proceed.  History of Present Illness Judy Wells is an 89 year old female with shortness of breath who presents for a refill of her albuterol inhaler. She was referred by Dr. Everlene Other for a routine check-up. She was seen by me previously in 2021 and had sleep study showed mild OSA with recommendation of avoiding supine position. She had PFTs which showed restriction to ventilation.   She experiences shortness of breath, particularly when walking down the hall to the utility room. She uses an albuterol inhaler twice daily, in the morning and at night. Relief from shortness of breath is achieved by sitting down and drinking water. No nocturnal awakenings needing the inhaler and no recent episodes of pneumonia or bronchitis are reported.  Denies recurrent bronchitis or pneumonia. Has a URI about once/year.   She has significant discomfort due to osteoarthritis, with her knees described as 'bone on bone'. A severe episode occurred two weeks ago when her knees gave out, necessitating a hospital visit. She uses naproxen twice daily for pain management. Initially, the right knee was more affected, but now both knees are painful. Despite the pain, she manages to walk at home using a walker, as she needs to care for her husband who has dementia.  Her sleep is reportedly good, aided by gabapentin taken at night. She has moved to a back room and no  longer watches TV before bed. She experiences a tremor, particularly when pouring coffee, which worsens when she is upset.   She drinks a lot of hibiscus tea, which she believes helps her health.   Never smoker.  Taking care of her husband at home who has dementia.  No longer drives. Social History   Occupational History    Employer: [employer]  Tobacco Use   Smoking status: Never   Smokeless tobacco: Never  Vaping Use   Vaping status: Never Used  Substance and Sexual Activity   Alcohol use: Yes    Alcohol/week: 0.0 standard drinks of alcohol    Comment: Rare   Drug use: No   Sexual activity: Not Currently    Comment: 1st intercourse 89 yo-Fewer than 5 partners    Relevant family history:  Family History  Problem Relation Age of Onset   Dementia Mother    Stroke Father    Stroke Maternal Grandmother    Aneurysm Sister    Heart disease Cousin    Stroke Sister     Past Medical History:  Diagnosis Date   Anemia    Coronary artery disease 1980's   Diabetes mellitus type II    Diverticulosis 2011   GERD (gastroesophageal reflux disease)    H. 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 Odessa Fleming) - after cardiac arrest while in [place]    Past Surgical History:  Procedure Laterality Date   ABDOMINAL ADHESION SURGERY     ABLATION  05/19/1997   Dr. Graciela Husbands - bidirectional conduction of right posterolateral AT - RF ablation   APPENDECTOMY     BREAST SURGERY     breast lump removed - tiny   CARDIAC CATHETERIZATION  02/19/1997   noraml L main, LAD free of disease, diagonal with no obstructions, L Cfx free of disease (along with OM & PDA being normal), RCA small & non-dominant & free of disease (Dr. Langston Reusing)   CATARACT EXTRACTION     COLONOSCOPY     NM MYOCAR PERF  WALL MOTION  01/2012   lexiscan myoview - normal pattern of perfusion in all regions, EF 64%     Physical Exam: Blood pressure 124/66, pulse (!) 54, height 5\' 2"  (1.575 m), weight 210 lb (95.3 kg), SpO2 99%. Gen:      No acute distress, elderly in wheelchair ENT:  no nasal polyps, mucus membranes moist Lungs:    No increased respiratory effort, symmetric chest wall excursion, clear to auscultation bilaterally, no wheezes or crackles CV:         Regular rate and rhythm; no murmurs, rubs, or gallops.  No pedal edema Abd:     Obese, soft  MSK: no acute synovitis of DIP or PIP joints, no mechanics hands.  Skin:      Warm and dry; no rashes Neuro: normal speech, no focal facial asymmetry Psych: alert and oriented x3, normal mood and affect   Data Reviewed/Medical Decision Making:  Independent interpretation of tests: Imaging:  Review of patient's chest xray June 2021 images revealed cardiomegaly, no acute pulmonary process. The patient's images have been independently reviewed by me.    PFTs: I have personally reviewed the patient's PFTs and they show restriction to ventilation    Latest Ref Rng & Units 03/10/2020   10:13 AM  PFT Results  FVC-Pre L 1.66   FVC-Predicted Pre % 98   FVC-Post L 1.54   FVC-Predicted Post % 90   Pre FEV1/FVC % % 79   Post FEV1/FCV % % 85   FEV1-Pre L 1.31   FEV1-Predicted Pre % 100   FEV1-Post L 1.30   DLCO uncorrected ml/min/mmHg 15.04   DLCO UNC% % 86   DLCO corrected ml/min/mmHg 15.04   DLCO COR %Predicted % 86   DLVA Predicted % 107   TLC L 3.48   TLC % Predicted % 73   RV % Predicted % 68     Labs:  Lab Results  Component Value Date   NA 140 10/26/2022   K 4.6 10/26/2022   CO2 25 10/26/2022   GLUCOSE 170 (H) 10/26/2022   BUN 10 10/26/2022   CREATININE 0.59 10/26/2022   CALCIUM 9.1 10/26/2022   EGFR 88 10/26/2022   GFRNONAA >60 05/08/2019   Lab Results  Component Value Date   NA 140 10/26/2022   K 4.6 10/26/2022   CO2 25  10/26/2022   GLUCOSE 170 (H) 10/26/2022   BUN 10 10/26/2022   CREATININE 0.59 10/26/2022   CALCIUM 9.1 10/26/2022   EGFR 88 10/26/2022   GFRNONAA >60 05/08/2019    Immunization status:  Immunization History  Administered Date(s) Administered   Fluad Quad(high Dose 65+) 01/26/2020   Influenza, Seasonal, Injecte, Preservative Fre 01/09/2016   PFIZER(Purple Top)SARS-COV-2 Vaccination 07/10/2019,  07/31/2019   Pfizer Covid-19 Vaccine Bivalent Booster 38yrs & up 05/11/2021   Pneumococcal Conjugate-13 01/12/2014   Pneumococcal Polysaccharide-23 10/21/2008, 07/06/2015   Pneumococcal-Unspecified 10/21/2008   Td 10/21/2008   Tdap 06/22/2011   Zoster, Live 10/21/2008     I reviewed prior external note(s) from cardiology, pcp, pulmonary  I reviewed the result(s) of the labs and imaging as noted above.   I have ordered   Assessment:  Assessment and Plan Assessment & Plan Shortness of breath - possible asthma although pfts show restriction to ventilation so more likely obesity deconditioning - Refilled albuterol inhaler prescription to [facility]. - Advised continued use of albuterol as needed. - Scheduled annual follow-up for COPD monitoring. - Instructed to contact office if breathing worsens.  Mild OSA - not on CPAP therapy  Allergic Rhinitis Well-controlled allergies with Zyrtec. - Continue Zyrtec as needed.   Return to Care: Return in about 1 year (around 08/07/2024).  Louie Rover, MD Pulmonary and Critical Care Medicine [facility] Office:[phone]  CC: Alfredia Ina, MD
